# Patient Record
Sex: Male | Born: 1955 | Race: White | Hispanic: No | Marital: Single | Smoking: Former smoker
Health system: Southern US, Community
[De-identification: ages and names within clinical notes are randomized; demographics above are authoritative.]

## PROBLEM LIST (undated history)

## (undated) ENCOUNTER — Emergency Department (HOSPITAL_COMMUNITY): Admission: EM | Payer: Self-pay | Source: Home / Self Care

## (undated) DIAGNOSIS — F17200 Nicotine dependence, unspecified, uncomplicated: Secondary | ICD-10-CM

## (undated) DIAGNOSIS — G2581 Restless legs syndrome: Secondary | ICD-10-CM

## (undated) DIAGNOSIS — E785 Hyperlipidemia, unspecified: Secondary | ICD-10-CM

## (undated) DIAGNOSIS — J302 Other seasonal allergic rhinitis: Secondary | ICD-10-CM

## (undated) DIAGNOSIS — I10 Essential (primary) hypertension: Secondary | ICD-10-CM

## (undated) DIAGNOSIS — IMO0002 Reserved for concepts with insufficient information to code with codable children: Secondary | ICD-10-CM

## (undated) DIAGNOSIS — C801 Malignant (primary) neoplasm, unspecified: Secondary | ICD-10-CM

## (undated) HISTORY — PX: APPENDECTOMY: SHX54

## (undated) HISTORY — DX: Hyperlipidemia, unspecified: E78.5

## (undated) HISTORY — DX: Other seasonal allergic rhinitis: J30.2

## (undated) HISTORY — DX: Nicotine dependence, unspecified, uncomplicated: F17.200

## (undated) HISTORY — DX: Restless legs syndrome: G25.81

## (undated) HISTORY — PX: INNER EAR SURGERY: SHX679

## (undated) HISTORY — DX: Essential (primary) hypertension: I10

## (undated) HISTORY — DX: Reserved for concepts with insufficient information to code with codable children: IMO0002

---
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
## 1986-03-27 DIAGNOSIS — IMO0002 Reserved for concepts with insufficient information to code with codable children: Secondary | ICD-10-CM

## 1986-03-27 HISTORY — DX: Reserved for concepts with insufficient information to code with codable children: IMO0002

## 1999-12-29 ENCOUNTER — Ambulatory Visit (HOSPITAL_BASED_OUTPATIENT_CLINIC_OR_DEPARTMENT_OTHER): Admission: RE | Admit: 1999-12-29 | Discharge: 1999-12-29 | Payer: Self-pay | Admitting: Otolaryngology

## 2001-02-28 ENCOUNTER — Encounter: Payer: Self-pay | Admitting: Otolaryngology

## 2001-03-01 ENCOUNTER — Ambulatory Visit (HOSPITAL_COMMUNITY): Admission: RE | Admit: 2001-03-01 | Discharge: 2001-03-01 | Payer: Self-pay | Admitting: Otolaryngology

## 2001-06-14 ENCOUNTER — Inpatient Hospital Stay (HOSPITAL_COMMUNITY): Admission: EM | Admit: 2001-06-14 | Discharge: 2001-06-20 | Payer: Self-pay | Admitting: *Deleted

## 2001-06-21 ENCOUNTER — Encounter (HOSPITAL_COMMUNITY): Admission: RE | Admit: 2001-06-21 | Discharge: 2001-06-27 | Payer: Self-pay | Admitting: General Surgery

## 2005-04-27 ENCOUNTER — Ambulatory Visit (HOSPITAL_COMMUNITY): Admission: RE | Admit: 2005-04-27 | Discharge: 2005-04-27 | Payer: Self-pay | Admitting: Family Medicine

## 2010-05-30 ENCOUNTER — Ambulatory Visit: Payer: BC Managed Care – PPO | Attending: Family Medicine

## 2010-05-30 DIAGNOSIS — G4733 Obstructive sleep apnea (adult) (pediatric): Secondary | ICD-10-CM | POA: Insufficient documentation

## 2012-07-19 ENCOUNTER — Telehealth: Payer: Self-pay | Admitting: Family Medicine

## 2012-07-19 MED ORDER — FENOFIBRATE 160 MG PO TABS: 160.0000 mg | ORAL_TABLET | Freq: Every day | ORAL | Status: DC

## 2012-07-19 NOTE — Telephone Encounter (Signed)
Rx Refilled  

## 2012-07-23 ENCOUNTER — Encounter: Payer: Self-pay | Admitting: Family Medicine

## 2012-07-23 ENCOUNTER — Ambulatory Visit (INDEPENDENT_AMBULATORY_CARE_PROVIDER_SITE_OTHER): Payer: BC Managed Care – PPO | Admitting: Family Medicine

## 2012-07-23 ENCOUNTER — Encounter: Payer: Self-pay | Admitting: Gastroenterology

## 2012-07-23 ENCOUNTER — Other Ambulatory Visit: Payer: Self-pay | Admitting: Family Medicine

## 2012-07-23 VITALS — BP 136/84 | HR 78 | Temp 98.0°F | Resp 16 | Wt 223.0 lb

## 2012-07-23 DIAGNOSIS — F17200 Nicotine dependence, unspecified, uncomplicated: Secondary | ICD-10-CM

## 2012-07-23 DIAGNOSIS — Z720 Tobacco use: Secondary | ICD-10-CM

## 2012-07-23 DIAGNOSIS — R5381 Other malaise: Secondary | ICD-10-CM

## 2012-07-23 DIAGNOSIS — E785 Hyperlipidemia, unspecified: Secondary | ICD-10-CM | POA: Insufficient documentation

## 2012-07-23 DIAGNOSIS — I10 Essential (primary) hypertension: Secondary | ICD-10-CM

## 2012-07-23 DIAGNOSIS — Z1211 Encounter for screening for malignant neoplasm of colon: Secondary | ICD-10-CM

## 2012-07-23 DIAGNOSIS — R5383 Other fatigue: Secondary | ICD-10-CM

## 2012-07-23 DIAGNOSIS — G2581 Restless legs syndrome: Secondary | ICD-10-CM | POA: Insufficient documentation

## 2012-07-23 DIAGNOSIS — Z125 Encounter for screening for malignant neoplasm of prostate: Secondary | ICD-10-CM

## 2012-07-23 LAB — CBC WITH DIFFERENTIAL/PLATELET
Eosinophils Absolute: 0.2 10*3/uL (ref 0.0–0.7)
Eosinophils Relative: 2 % (ref 0–5)
HCT: 47.6 % (ref 39.0–52.0)
Lymphs Abs: 2 10*3/uL (ref 0.7–4.0)
MCH: 29.8 pg (ref 26.0–34.0)
MCV: 85 fL (ref 78.0–100.0)
Monocytes Absolute: 0.7 10*3/uL (ref 0.1–1.0)
Platelets: 294 10*3/uL (ref 150–400)
RBC: 5.6 MIL/uL (ref 4.22–5.81)
RDW: 14.1 % (ref 11.5–15.5)

## 2012-07-23 LAB — HEPATIC FUNCTION PANEL
ALT: 25 U/L (ref 0–53)
Albumin: 4.2 g/dL (ref 3.5–5.2)
Alkaline Phosphatase: 56 U/L (ref 39–117)
Indirect Bilirubin: 0.4 mg/dL (ref 0.0–0.9)
Total Protein: 6.4 g/dL (ref 6.0–8.3)

## 2012-07-23 MED ORDER — ROPINIROLE HCL 0.25 MG PO TABS: 0.2500 mg | ORAL_TABLET | Freq: Three times a day (TID) | ORAL | Status: DC

## 2012-07-23 NOTE — Progress Notes (Signed)
Subjective:    Patient ID: Kevin [name], male    DOB: 18-Oct-1955, 57 y.o.   MRN: [id number]  HPI  Patient reports that he is having a difficult time sleeping. He states he goes to sleep easily. However he awakens 2-3 times every night. He states that when he wakes up the sheets or tingling not due to his constant motion, flailing, and leg movement. 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 He tried Mirapex at that point but did not like the way this made him feel.  He also reports fatigue. He has no desire to do anything. His stamina has been greatly diminished. He also reports increasing abdominal girth. However his weight is remaining the same. He reports increasing muscle mass.  He also reports mild swelling in both ankles. He is taking Norvasc 10 mg by mouth daily for his blood pressure.  He also reports a daily cough. Is productive of thick mucus. He smokes approximately one pack per day and has for many years. Past Medical History  Diagnosis Date  . Hypertension   . Smoker   . Restless legs syndrome (RLS)    Current Outpatient Prescriptions on File Prior to Visit  Medication Sig Dispense Refill  . fenofibrate 160 MG tablet Take 1 tablet (160 mg total) by mouth daily.  30 tablet  5   No current facility-administered medications on file prior to visit.   No Known Allergies History   Social History  . Marital Status: Single    Spouse Name: N/A    Number of Children: N/A  . Years of Education: N/A   Occupational History  . Not on file.   Social History Main Topics  . Smoking status: Current Every Day Smoker -- 1.00 packs/day    Types: Cigarettes, Cigars  . Smokeless tobacco: Not on file  . Alcohol Use: No  . Drug Use: No  . Sexually Active: Not on file   Other Topics Concern  . Not on file   Social History Narrative  . No narrative on file     Review of Systems  All other systems reviewed and are  negative.       Objective:   Physical Exam  Constitutional: He is oriented to person, place, and time. He appears well-developed and well-nourished.  Eyes: Conjunctivae are normal. Pupils are equal, round, and reactive to light.  Neck: Neck supple. No thyromegaly present.  Cardiovascular: Normal rate, regular rhythm and intact distal pulses.  Exam reveals no gallop and no friction rub.   No murmur heard. Pulmonary/Chest: Effort normal and breath sounds normal. No respiratory distress. He has no wheezes. He has no rales. He exhibits no tenderness.  Abdominal: Soft. Bowel sounds are normal. He exhibits no distension and no mass. There is no tenderness. There is no rebound and no guarding.  Lymphadenopathy:    He has no cervical adenopathy.  Neurological: He is alert and oriented to person, place, and time. He has normal reflexes.          Assessment & Plan:  1. Other malaise and fatigue Obtain some baseline screening labs. I feel patient likely has hypogonadism. - Testosterone - TSH - Basic Metabolic Panel - CBC with Differential - Hepatic Function Panel  2. Prostate cancer screening Also recommended he schedule a colonoscopy due to his age. I will facilitate. - PSA  3. Tobacco abuse Recommend smoking cessation  4. HTN (hypertension) Blood pressure is  well controlled, I recommend he continue the Norvasc and reassure the patient that the swelling in his ankles is normal.  5. RLS (restless legs syndrome) Try Requip 0.25 mg tab, one tablet at night for the first week, 2 tablets at night for the second week, 3 tablets at night for the third week. Advised patient follow with me in one month to discuss how the medicine is working.

## 2012-07-24 ENCOUNTER — Telehealth: Payer: Self-pay | Admitting: Family Medicine

## 2012-07-24 LAB — BASIC METABOLIC PANEL
BUN: 11 mg/dL (ref 6–23)
Chloride: 101 mEq/L (ref 96–112)
Glucose, Bld: 132 mg/dL — ABNORMAL HIGH (ref 70–99)
Potassium: 3.9 mEq/L (ref 3.5–5.3)
Sodium: 143 mEq/L (ref 135–145)

## 2012-07-24 LAB — HEMOGLOBIN A1C: Mean Plasma Glucose: 111 mg/dL (ref <117)

## 2012-07-24 MED ORDER — AMLODIPINE BESYLATE 10 MG PO TABS: 10.0000 mg | ORAL_TABLET | Freq: Every day | ORAL | Status: DC

## 2012-07-24 MED ORDER — HYDROCHLOROTHIAZIDE 25 MG PO TABS: 25.0000 mg | ORAL_TABLET | Freq: Every day | ORAL | Status: DC

## 2012-07-24 NOTE — Telephone Encounter (Signed)
Rx Refilled  

## 2012-07-25 ENCOUNTER — Other Ambulatory Visit: Payer: Self-pay | Admitting: Family Medicine

## 2012-08-12 ENCOUNTER — Telehealth: Payer: Self-pay | Admitting: Family Medicine

## 2012-08-12 NOTE — Telephone Encounter (Signed)
Did requip help? what dose did he take 2 or 3 pills at night?

## 2012-08-13 ENCOUNTER — Telehealth: Payer: Self-pay | Admitting: *Deleted

## 2012-08-13 ENCOUNTER — Telehealth: Payer: Self-pay | Admitting: Family Medicine

## 2012-08-13 NOTE — Telephone Encounter (Signed)
Was given enough at last office visit to hold him until follow up visit

## 2012-08-13 NOTE — Telephone Encounter (Signed)
Kevin [name], did we verify with the patient, is he now taking requip 0.25 mg tabs, 3 tabs at night. If so, please call this to his pharmacy.

## 2012-08-14 MED ORDER — ROPINIROLE HCL 0.25 MG PO TABS: 0.2500 mg | ORAL_TABLET | Freq: Three times a day (TID) | ORAL | Status: DC

## 2012-08-14 NOTE — Telephone Encounter (Signed)
Verified w/patient and refilled meds

## 2012-08-14 NOTE — Telephone Encounter (Signed)
Rx sent 

## 2012-08-23 ENCOUNTER — Encounter: Payer: Self-pay | Admitting: Gastroenterology

## 2012-08-23 ENCOUNTER — Ambulatory Visit (AMBULATORY_SURGERY_CENTER): Payer: BC Managed Care – PPO | Admitting: *Deleted

## 2012-08-23 VITALS — Ht 74.0 in | Wt 220.0 lb

## 2012-08-23 DIAGNOSIS — Z1211 Encounter for screening for malignant neoplasm of colon: Secondary | ICD-10-CM

## 2012-08-23 MED ORDER — MOVIPREP 100 G PO SOLR: ORAL | Status: DC

## 2012-09-02 ENCOUNTER — Ambulatory Visit (AMBULATORY_SURGERY_CENTER): Payer: BC Managed Care – PPO | Admitting: Gastroenterology

## 2012-09-02 ENCOUNTER — Encounter: Payer: Self-pay | Admitting: Gastroenterology

## 2012-09-02 VITALS — BP 141/94 | HR 75 | Temp 97.8°F | Resp 16 | Ht 74.0 in | Wt 220.0 lb

## 2012-09-02 DIAGNOSIS — D126 Benign neoplasm of colon, unspecified: Secondary | ICD-10-CM

## 2012-09-02 DIAGNOSIS — Z1211 Encounter for screening for malignant neoplasm of colon: Secondary | ICD-10-CM

## 2012-09-02 MED ORDER — SODIUM CHLORIDE 0.9 % IV SOLN: 500.0000 mL | INTRAVENOUS | Status: DC

## 2012-09-02 NOTE — Progress Notes (Signed)
Procedure rnds, to recovery, report given and VSS

## 2012-09-02 NOTE — Op Note (Signed)
[facility] [address] [place], [address]   COLONOSCOPY PROCEDURE REPORT  PATIENT: Kevin, [name]  MR#: [id number] BIRTHDATE: [birth year] , 56  yrs. old GENDER: Male ENDOSCOPIST: Mardella Layman, MD, [facility] REFERRED BY: PROCEDURE DATE:  09/02/2012 PROCEDURE:   Colonoscopy with snare polypectomy ASA CLASS:   Class II INDICATIONS:average risk screening. MEDICATIONS: propofol (Diprivan) 400mg  IV  DESCRIPTION OF PROCEDURE:   After the risks and benefits and of the procedure were explained, informed consent was obtained.  A digital rectal exam revealed no abnormalities of the rectum.    The LB [id number]  endoscope was introduced through the anus and advanced to the cecum, which was identified by both the appendix and ileocecal valve .  The quality of the prep was excellent, using MoviPrep .  The instrument was then slowly withdrawn as the colon was fully examined.     COLON FINDINGS:a flat spreading cecal polyp measuring 15 mm in diameter removed with electrocautery snare and retrieved in one piece.  This was labeled specimen #1. A smooth flat polyp ranging between 3-32mm in size was found at the hepatic flexure.  A polypectomy was performed using snare cautery.  The resection was complete and the polyp tissue was not retrieved.   Three polypoid shaped sessile polyps ranging between 5-43mm in size were found in the sigmoid colon.  A polypectomy was performed using snare cautery.  The resection was complete and the polyp tissue was partially retrieved.     Retroflexed views revealed no abnormalities.     The scope was then withdrawn from the patient and the procedure completed.  COMPLICATIONS: There were no complications. ENDOSCOPIC IMPRESSION: 1.   Flat polyp ranging between 3-72mm in size was found at the hepatic flexure; polypectomy was performed using snare cautery ,larger cecal polyp removed in base of cecum and snare excised. 2.  Three sessile  polyps ranging between 5-65mm in size were found in the sigmoid colon; polypectomy was performed using snare cautery  RECOMMENDATIONS: 1.  Await pathology results 2.  Repeat Colonoscopy in 3 years.   REPEAT EXAM:  VH:QIONGEX, Tom MD  _______________________________ eSigned:  Mardella Layman, MD, [facility] 09/02/2012 11:28 AM     PATIENT NAME:  Kevin, [name] MR#: [id number]

## 2012-09-02 NOTE — Progress Notes (Signed)
Patient did not experience any of the following events: a burn prior to discharge; a fall within the facility; wrong site/side/patient/procedure/implant event; or a hospital transfer or hospital admission upon discharge from the facility. (G8907) Patient did not have preoperative order for IV antibiotic SSI prophylaxis. (G8918)  

## 2012-09-02 NOTE — Patient Instructions (Signed)
Colon polyps removed today. Handout given on polyps. No aspirin,aspirin containing products, or anti-inflammatory medications for 2 weeks. Resume current medications. Repeat colonoscopy in 3 years. Call us with any questions or concerns. Thank you!!   YOU HAD AN ENDOSCOPIC PROCEDURE TODAY AT THE [facility]: Refer to the procedure report that was given to you for any specific questions about what was found during the examination.  If the procedure report does not answer your questions, please call your gastroenterologist to clarify.  If you requested that your care partner not be given the details of your procedure findings, then the procedure report has been included in a sealed envelope for you to review at your convenience later.  YOU SHOULD EXPECT: Some feelings of bloating in the abdomen. Passage of more gas than usual.  Walking can help get rid of the air that was put into your GI tract during the procedure and reduce the bloating. If you had a lower endoscopy (such as a colonoscopy or flexible sigmoidoscopy) you may notice spotting of blood in your stool or on the toilet paper. If you underwent a bowel prep for your procedure, then you may not have a normal bowel movement for a few days.  DIET: Your first meal following the procedure should be a light meal and then it is ok to progress to your normal diet.  A half-sandwich or bowl of soup is an example of a good first meal.  Heavy or fried foods are harder to digest and may make you feel nauseous or bloated.  Likewise meals heavy in dairy and vegetables can cause extra gas to form and this can also increase the bloating.  Drink plenty of fluids but you should avoid alcoholic beverages for 24 hours.  ACTIVITY: Your care partner should take you home directly after the procedure.  You should plan to take it easy, moving slowly for the rest of the day.  You can resume normal activity the day after the procedure however you should NOT DRIVE  or use heavy machinery for 24 hours (because of the sedation medicines used during the test).    SYMPTOMS TO REPORT IMMEDIATELY: A gastroenterologist can be reached at any hour.  During normal business hours, 8:30 AM to 5:00 PM Monday through Friday, call [phone].  After hours and on weekends, please call the GI answering service at [phone] who will take a message and have the physician on call contact you.   Following lower endoscopy (colonoscopy or flexible sigmoidoscopy):  Excessive amounts of blood in the stool  Significant tenderness or worsening of abdominal pains  Swelling of the abdomen that is new, acute  Fever of 100F or higher  Following upper endoscopy (EGD)  Vomiting of blood or coffee ground material  New chest pain or pain under the shoulder blades  Painful or persistently difficult swallowing  New shortness of breath  Fever of 100F or higher  Black, tarry-looking stools  FOLLOW UP: If any biopsies were taken you will be contacted by phone or by letter within the next 1-3 weeks.  Call your gastroenterologist if you have not heard about the biopsies in 3 weeks.  Our staff will call the home number listed on your records the next business day following your procedure to check on you and address any questions or concerns that you may have at that time regarding the information given to you following your procedure. This is a courtesy call and so if there is no answer  at the home number and we have not heard from you through the emergency physician on call, we will assume that you have returned to your regular daily activities without incident.  SIGNATURES/CONFIDENTIALITY: You and/or your care partner have signed paperwork which will be entered into your electronic medical record.  These signatures attest to the fact that that the information above on your After Visit Summary has been reviewed and is understood.  Full responsibility of the confidentiality of this  discharge information lies with you and/or your care-partner.

## 2012-09-02 NOTE — Progress Notes (Signed)
Called to room to assist during endoscopic procedure.  Patient ID and intended procedure confirmed with present staff. Received instructions for my participation in the procedure from the performing physician.  

## 2012-09-03 ENCOUNTER — Telehealth: Payer: Self-pay

## 2012-09-03 NOTE — Telephone Encounter (Signed)
  Follow up Call-  Call back number 09/02/2012  Post procedure Call Back phone  # [phone]  Permission to leave phone message Yes     Patient questions:  Do you have a fever, pain , or abdominal swelling? no Pain Score  0 *  Have you tolerated food without any problems? yes  Have you been able to return to your normal activities? yes  Do you have any questions about your discharge instructions: Diet   no Medications  no Follow up visit  no  Do you have questions or concerns about your Care? no  Actions: * If pain score is 4 or above: No action needed, pain <4.

## 2012-09-06 ENCOUNTER — Encounter: Payer: Self-pay | Admitting: Gastroenterology

## 2012-09-23 ENCOUNTER — Emergency Department (INDEPENDENT_AMBULATORY_CARE_PROVIDER_SITE_OTHER): Payer: BC Managed Care – PPO

## 2012-09-23 ENCOUNTER — Emergency Department (INDEPENDENT_AMBULATORY_CARE_PROVIDER_SITE_OTHER)
Admission: EM | Admit: 2012-09-23 | Discharge: 2012-09-23 | Disposition: A | Discharge status: Home / Self Care | Payer: BC Managed Care – PPO | Source: Home / Self Care | Attending: Emergency Medicine | Admitting: Emergency Medicine

## 2012-09-23 DIAGNOSIS — R600 Localized edema: Secondary | ICD-10-CM

## 2012-09-23 DIAGNOSIS — R609 Edema, unspecified: Secondary | ICD-10-CM

## 2012-09-23 DIAGNOSIS — E876 Hypokalemia: Secondary | ICD-10-CM

## 2012-09-23 LAB — PRO B NATRIURETIC PEPTIDE: Pro B Natriuretic peptide (BNP): 71.2 pg/mL (ref 0–125)

## 2012-09-23 LAB — POCT I-STAT, CHEM 8
Chloride: 96 mEq/L (ref 96–112)
Glucose, Bld: 99 mg/dL (ref 70–99)
HCT: 51 % (ref 39.0–52.0)
Hemoglobin: 17.3 g/dL — ABNORMAL HIGH (ref 13.0–17.0)
Potassium: 3.1 mEq/L — ABNORMAL LOW (ref 3.5–5.1)

## 2012-09-23 MED ORDER — FUROSEMIDE 40 MG PO TABS: 40.0000 mg | ORAL_TABLET | Freq: Every day | ORAL | Status: DC

## 2012-09-23 MED ORDER — POTASSIUM CHLORIDE ER 20 MEQ PO TBCR: 20.0000 meq | EXTENDED_RELEASE_TABLET | Freq: Every day | ORAL | Status: DC

## 2012-09-23 NOTE — ED Notes (Signed)
C/o bilateral leg swelling with tenderness.  Denies injury.

## 2012-09-23 NOTE — ED Provider Notes (Signed)
History    CSN: [id number] Arrival date & time 09/23/12  1550  First MD Initiated Contact with Patient 09/23/12 1814     Chief Complaint  Patient presents with  . Leg Swelling   (Consider location/radiation/quality/duration/timing/severity/associated sxs/prior Treatment) HPI  57 yo wm presents today with bilat le edema.  States that this has been ongoing for at least one month.  Has brought this up to PCP but say nothing was done.  Has not been on any long trips or flights.  Admits occasional exertional dyspnea.  Denies cp, orthopnea, fever,chills, abd pain.     Past Medical History  Diagnosis Date  . Hypertension   . Smoker     Quit 08/04/12  . Restless legs syndrome (RLS)   . Ulcer 1988   Past Surgical History  Procedure Laterality Date  . Appendectomy  1957    hernia repair at the same time  . Inner ear surgery  2000, 2002, 2007    bilateral  2x's on right and 1x on the left  . Tonsilectomy, adenoidectomy, bilateral myringotomy and tubes  1965   Family History  Problem Relation Age of Onset  . Colon polyps Brother   . Colon cancer Paternal Aunt   . Colon cancer Cousin   . Lung cancer Father 11   History  Substance Use Topics  . Smoking status: Former Smoker -- 0.00 packs/day    Types: Cigarettes, Cigars    Quit date: 08/04/2012  . Smokeless tobacco: Never Used  . Alcohol Use: 13.2 oz/week    12 Cans of beer, 10 Shots of liquor per week    Review of Systems  Constitutional: Negative.   HENT: Negative.   Eyes: Negative.   Respiratory: Positive for shortness of breath (exertional).   Cardiovascular: Negative.   Gastrointestinal: Negative.   Endocrine: Negative.   Genitourinary: Negative.   Musculoskeletal:       Leg swelling   Skin: Negative.   Neurological: Negative.   Psychiatric/Behavioral: Negative.     Allergies  Review of patient's allergies indicates no known allergies.  Home Medications   Current Outpatient Rx  Name  Route  Sig  Dispense   Refill  . amLODipine (NORVASC) 10 MG tablet   Oral   Take 1 tablet (10 mg total) by mouth daily.   30 tablet   5   . aspirin 81 MG tablet   Oral   Take 81 mg by mouth daily.         . fenofibrate 160 MG tablet   Oral   Take 1 tablet (160 mg total) by mouth daily.   30 tablet   5   . fluticasone (FLONASE) 50 MCG/ACT nasal spray   Nasal   Place 2 sprays into the nose daily.         . furosemide (LASIX) 40 MG tablet   Oral   Take 1 tablet (40 mg total) by mouth daily.   15 tablet   0   . potassium chloride 20 MEQ TBCR   Oral   Take 20 mEq by mouth daily with breakfast.   15 tablet   0   . rOPINIRole (REQUIP) 0.25 MG tablet   Oral   Take 1 tablet (0.25 mg total) by mouth 3 (three) times daily. r.   90 tablet   1    BP 139/94  Pulse 82  Temp(Src) 97.9 F (36.6 C)  Resp 16  SpO2 98% Physical Exam  Constitutional: He is oriented to  person, place, and time. He appears well-developed and well-nourished.  HENT:  Head: Normocephalic and atraumatic.  Eyes: EOM are normal. Pupils are equal, round, and reactive to light.  Neck: Normal range of motion.  Cardiovascular: Normal rate and regular rhythm.   Pulmonary/Chest: Effort normal and breath sounds normal.  Abdominal: Soft. Bowel sounds are normal.  Musculoskeletal: He exhibits edema (bilat 3+ pretib ) and tenderness (bilat calves).  Neurological: He is alert and oriented to person, place, and time.  Skin: Skin is warm and dry.  Psychiatric: He has a normal mood and affect.    ED Course  Procedures (including critical care time) Labs Reviewed  POCT I-STAT, CHEM 8 - Abnormal; Notable for the following:    Potassium 3.1 (*)    Calcium, Ion 1.07 (*)    Hemoglobin 17.3 (*)    All other components within normal limits  PRO B NATRIURETIC PEPTIDE   Dg Chest 2 View  09/23/2012   *RADIOLOGY REPORT*  Clinical Data: Lower extremity swelling.  CHEST - 2 VIEW  Comparison:  None.  Findings:  The heart size and  mediastinal contours are within normal limits.  Both lungs are clear.  The visualized skeletal structures are unremarkable.  IMPRESSION: No active cardiopulmonary disease.   Original Report Authenticated By: Myles Rosenthal, M.D.   1. Bilateral leg edema   2. Hypokalemia     MDM  Pt will d/c hctz and was give script for lasix for leg edema.  Advised that he must f/u with his PCP this week for recheck.  Also given script for kcl.  Elevate legs as much as possible and he can wear compression hose.  Voices understanding.  Will call him to see how he is feeling tomorrow.  May need venous dopplers if calves are still sore after using lasix.   Meds ordered this encounter  Medications  . furosemide (LASIX) 40 MG tablet    Sig: Take 1 tablet (40 mg total) by mouth daily.    Dispense:  15 tablet    Refill:  0  . potassium chloride 20 MEQ TBCR    Sig: Take 20 mEq by mouth daily with breakfast.    Dispense:  15 tablet    Refill:  0      Zonia Kief, PA-C 09/24/12 [phone]

## 2012-09-23 NOTE — ED Notes (Signed)
Opened chart for review.

## 2012-09-24 NOTE — ED Provider Notes (Signed)
Medical screening examination/treatment/procedure(s) were performed by non-physician practitioner and as supervising physician I was immediately available for consultation/collaboration.  Ashaun Gaughan, M.D.  Christopherjame Carnell C Ulanda Tackett, MD 09/24/12 1903 

## 2012-09-25 ENCOUNTER — Telehealth: Payer: Self-pay | Admitting: Family Medicine

## 2012-09-25 ENCOUNTER — Encounter: Payer: Self-pay | Admitting: Family Medicine

## 2012-09-25 NOTE — Progress Notes (Signed)
This encounter was created in error - please disregard.

## 2012-09-25 NOTE — Telephone Encounter (Signed)
Called pt and left message on cell phone to call us to set up a follow up appt. To see Dr. Tanya Nones.  *Pt has contacted our office several times in regards to his medications but has not mentioned his legs swelling any more then what was discussed at his ov 07/23/12 and patient was suppose to follow up with Dr. Tanya Nones in 1 month which would have been 08/22/12 but pt failed to do so. He has not been seen since 07/23/12 and we have received no phone message in regards to his leg swelling.

## 2012-09-29 ENCOUNTER — Encounter: Payer: Self-pay | Admitting: Family Medicine

## 2012-09-30 ENCOUNTER — Ambulatory Visit: Payer: BC Managed Care – PPO | Admitting: Family Medicine

## 2012-09-30 ENCOUNTER — Telehealth: Payer: Self-pay | Admitting: Family Medicine

## 2013-01-30 ENCOUNTER — Other Ambulatory Visit: Payer: Self-pay

## 2013-12-26 ENCOUNTER — Encounter: Payer: Self-pay | Admitting: Cardiology

## 2013-12-26 ENCOUNTER — Ambulatory Visit (INDEPENDENT_AMBULATORY_CARE_PROVIDER_SITE_OTHER): Payer: BC Managed Care – PPO | Admitting: Cardiology

## 2013-12-26 VITALS — BP 120/82 | HR 78 | Ht 74.0 in | Wt 221.0 lb

## 2013-12-26 DIAGNOSIS — I10 Essential (primary) hypertension: Secondary | ICD-10-CM

## 2013-12-26 DIAGNOSIS — R600 Localized edema: Secondary | ICD-10-CM

## 2013-12-26 DIAGNOSIS — R0602 Shortness of breath: Secondary | ICD-10-CM

## 2013-12-26 NOTE — Patient Instructions (Signed)
Your physician recommends that you schedule a follow-up appointment in: 3-4 weeks     Your physician has requested that you have an echocardiogram. Echocardiography is a painless test that uses sound waves to create images of your heart. It provides your doctor with information about the size and shape of your heart and how well your heart's chambers and valves are working. This procedure takes approximately one hour. There are no restrictions for this procedure.       Your physician recommends that you continue on your current medications as directed. Please refer to the Current Medication list given to you today.        Thank you for choosing [facility] !

## 2013-12-26 NOTE — Progress Notes (Signed)
Clinical Summary Kevin [name] is a 58 y.o.male seen today as a new patient for the following medical problems.   1. SOB - started 1 year ago. DOE 1/4 to 1/2 mile, change from 1 year ago where he was able to tolerate much higher levels of exertion - no chest pain. + LE edema, improved with lasix. No orthopnea, no PND, no LE edema. No significant palpitations.    Past Medical History  Diagnosis Date  . Hypertension   . Smoker     Quit 08/04/12  . Restless legs syndrome (RLS)   . Ulcer 1988     No Known Allergies   Current Outpatient Prescriptions  Medication Sig Dispense Refill  . amLODipine (NORVASC) 10 MG tablet Take 1 tablet (10 mg total) by mouth daily.  30 tablet  5  . aspirin 81 MG tablet Take 81 mg by mouth daily.      . fenofibrate 160 MG tablet Take 1 tablet (160 mg total) by mouth daily.  30 tablet  5  . fluticasone (FLONASE) 50 MCG/ACT nasal spray Place 2 sprays into the nose daily.      . furosemide (LASIX) 40 MG tablet Take 1 tablet (40 mg total) by mouth daily.  15 tablet  0  . potassium chloride 20 MEQ TBCR Take 20 mEq by mouth daily with breakfast.  15 tablet  0  . rOPINIRole (REQUIP) 0.25 MG tablet Take 1 tablet (0.25 mg total) by mouth 3 (three) times daily. r.  90 tablet  1  . [DISCONTINUED] hydrochlorothiazide (HYDRODIURIL) 25 MG tablet Take 1 tablet (25 mg total) by mouth daily.  30 tablet  5   No current facility-administered medications for this visit.     Past Surgical History  Procedure Laterality Date  . Appendectomy  1957    hernia repair at the same time  . Inner ear surgery  2000, 2002, 2007    bilateral  2x's on right and 1x on the left  . Tonsilectomy, adenoidectomy, bilateral myringotomy and tubes  1965     No Known Allergies    Family History  Problem Relation Age of Onset  . Colon polyps Brother   . Colon cancer Paternal Aunt   . Colon cancer Cousin   . Lung cancer Father 54     Social History Kevin [name] reports that  he quit smoking about 16 months ago. His smoking use included Cigarettes and Cigars. He smoked 0.00 packs per day. He has never used smokeless tobacco. Kevin [name] reports that he drinks about 13.2 ounces of alcohol per week.   Review of Systems CONSTITUTIONAL: No weight loss, fever, chills, weakness or fatigue.  HEENT: Eyes: No visual loss, blurred vision, double vision or yellow sclerae.No hearing loss, sneezing, congestion, runny nose or sore throat.  SKIN: No rash or itching.  CARDIOVASCULAR: per HPI RESPIRATORY: No shortness of breath, cough or sputum.  GASTROINTESTINAL: No anorexia, nausea, vomiting or diarrhea. No abdominal pain or blood.  GENITOURINARY: No burning on urination, no polyuria NEUROLOGICAL: No headache, dizziness, syncope, paralysis, ataxia, numbness or tingling in the extremities. No change in bowel or bladder control.  MUSCULOSKELETAL: + leg swelling LYMPHATICS: No enlarged nodes. No history of splenectomy.  PSYCHIATRIC: No history of depression or anxiety.  ENDOCRINOLOGIC: No reports of sweating, cold or heat intolerance. No polyuria or polydipsia.  Marland Kitchen   Physical Examination p 78 bp 120/82 Wt 221 lbs BMI 28 Gen: resting comfortably, no acute distress HEENT: no scleral icterus,  pupils equal round and reactive, no palptable cervical adenopathy,  CV: RRR, no m/r/g, no JVD, no carotid bruits Resp: Clear to auscultation bilaterally GI: abdomen is soft, non-tender, non-distended, normal bowel sounds, no hepatosplenomegaly MSK: extremities are warm, trace bilateral edema Skin: warm, no rash Neuro:  no focal deficits Psych: appropriate affect   Diagnostic Studies 12/26/13 Clinic EKG NSR    Assessment and Plan  1. SOB - notes increased SOB and DOE over the last year, along with LE edema around that same period of time - obtain echo to evaluate for possible cardiac etiology  F/u 3-4 weeks      Arnoldo Lenis, M.D.

## 2014-01-02 ENCOUNTER — Ambulatory Visit (HOSPITAL_COMMUNITY)
Admission: RE | Admit: 2014-01-02 | Discharge: 2014-01-02 | Disposition: A | Discharge status: Home / Self Care | Payer: BC Managed Care – PPO | Source: Ambulatory Visit | Attending: Cardiology | Admitting: Cardiology

## 2014-01-02 DIAGNOSIS — I10 Essential (primary) hypertension: Secondary | ICD-10-CM | POA: Diagnosis not present

## 2014-01-02 DIAGNOSIS — I071 Rheumatic tricuspid insufficiency: Secondary | ICD-10-CM | POA: Insufficient documentation

## 2014-01-02 DIAGNOSIS — Z87891 Personal history of nicotine dependence: Secondary | ICD-10-CM | POA: Insufficient documentation

## 2014-01-02 DIAGNOSIS — R0602 Shortness of breath: Secondary | ICD-10-CM | POA: Insufficient documentation

## 2014-01-02 DIAGNOSIS — I517 Cardiomegaly: Secondary | ICD-10-CM

## 2014-01-02 NOTE — Progress Notes (Signed)
  Echocardiogram 2D Echocardiogram has been performed.  [name], Kevin 01/02/2014, 2:50 PM

## 2014-01-30 ENCOUNTER — Encounter: Payer: Self-pay | Admitting: Adult Health

## 2014-01-30 ENCOUNTER — Ambulatory Visit (INDEPENDENT_AMBULATORY_CARE_PROVIDER_SITE_OTHER): Payer: BC Managed Care – PPO | Admitting: Adult Health

## 2014-01-30 VITALS — BP 116/78 | HR 84 | Ht 74.0 in | Wt 223.0 lb

## 2014-01-30 DIAGNOSIS — I10 Essential (primary) hypertension: Secondary | ICD-10-CM

## 2014-01-30 DIAGNOSIS — Z720 Tobacco use: Secondary | ICD-10-CM

## 2014-01-30 NOTE — Progress Notes (Deleted)
Name: Kevin [name]    DOB: 1955-10-20  Age: 58 y.o.  MR#: [id number]       PCP:  Delphina Cahill, MD      Insurance: Payor: [employer] / Plan: [place] / Product Type: *No Product type* /   CC:    Chief Complaint  Patient presents with  . Shortness of Breath    VS Filed Vitals:   01/30/14 1304  BP: 116/78  Pulse: 84  Height: 6\' 2"  (1.88 m)  Weight: 223 lb (101.152 kg)    Weights Current Weight  01/30/14 223 lb (101.152 kg)  12/26/13 221 lb (100.245 kg)  09/02/12 220 lb (99.791 kg)    Blood Pressure  BP Readings from Last 3 Encounters:  01/30/14 116/78  12/26/13 120/82  09/23/12 139/94     Admit date:  (Not on file) Last encounter with RMR:  Visit date not found   Allergy Review of patient's allergies indicates no known allergies.  Current Outpatient Prescriptions  Medication Sig Dispense Refill  . aspirin 81 MG tablet Take 81 mg by mouth daily.    . Fenofibrate Micronized (ANTARA) 90 MG CAPS Take 90 mg by mouth daily.    . fluticasone (FLONASE) 50 MCG/ACT nasal spray Place 2 sprays into the nose daily.    . furosemide (LASIX) 40 MG tablet Take 1 tablet (40 mg total) by mouth daily. 15 tablet 0  . olmesartan (BENICAR) 20 MG tablet Take 20 mg by mouth daily.    . potassium chloride 20 MEQ TBCR Take 20 mEq by mouth daily with breakfast. 15 tablet 0  . zolpidem (AMBIEN) 10 MG tablet Take 10 mg by mouth at bedtime as needed for sleep. 1/2 tab as needed    . [DISCONTINUED] hydrochlorothiazide (HYDRODIURIL) 25 MG tablet Take 1 tablet (25 mg total) by mouth daily. 30 tablet 5   No current facility-administered medications for this visit.    Discontinued Meds:   There are no discontinued medications.  Patient Active Problem List   Diagnosis Date Noted  . Tobacco abuse 07/23/2012  . HTN (hypertension) 07/23/2012  . RLS (restless legs syndrome) 07/23/2012  . HLD (hyperlipidemia) 07/23/2012  . Hypertension   . Restless legs syndrome (RLS)      LABS    Component Value Date/Time   NA 140 09/23/2012 1849   NA 143 07/23/2012 0937   K 3.1* 09/23/2012 1849   K 3.9 07/23/2012 0937   CL 96 09/23/2012 1849   CL 101 07/23/2012 0937   CO2 26 07/23/2012 0937   GLUCOSE 99 09/23/2012 1849   GLUCOSE 132* 07/23/2012 0937   BUN 16 09/23/2012 1849   BUN 11 07/23/2012 0937   CREATININE 1.10 09/23/2012 1849   CREATININE 0.91 07/23/2012 0937   CALCIUM 9.7 07/23/2012 0937   CMP     Component Value Date/Time   NA 140 09/23/2012 1849   K 3.1* 09/23/2012 1849   CL 96 09/23/2012 1849   CO2 26 07/23/2012 0937   GLUCOSE 99 09/23/2012 1849   BUN 16 09/23/2012 1849   CREATININE 1.10 09/23/2012 1849   CREATININE 0.91 07/23/2012 0937   CALCIUM 9.7 07/23/2012 0937   PROT 6.4 07/23/2012 0932   ALBUMIN 4.2 07/23/2012 0932   AST 19 07/23/2012 0932   ALT 25 07/23/2012 0932   ALKPHOS 56 07/23/2012 0932   BILITOT 0.5 07/23/2012 0932       Component Value Date/Time   WBC 9.2 07/23/2012 0937  HGB 17.3* 09/23/2012 1849   HGB 16.7 07/23/2012 0937   HCT 51.0 09/23/2012 1849   HCT 47.6 07/23/2012 0937   MCV 85.0 07/23/2012 0937    Lipid Panel  No results found for: CHOL, TRIG, HDL, CHOLHDL, VLDL, LDLCALC, LDLDIRECT  ABG    Component Value Date/Time   TCO2 31 09/23/2012 1849     Lab Results  Component Value Date   TSH 0.679 07/23/2012   BNP (last 3 results) No results for input(s): PROBNP in the last 8760 hours. Cardiac Panel (last 3 results) No results for input(s): CKTOTAL, CKMB, TROPONINI, RELINDX in the last 72 hours.  Iron/TIBC/Ferritin/ %Sat No results found for: IRON, TIBC, FERRITIN, IRONPCTSAT   EKG Orders placed or performed in visit on 12/26/13  . EKG 12-Lead     Prior Assessment and Plan Problem List as of 01/30/2014      Cardiovascular and Mediastinum   HTN (hypertension)   Hypertension     Other   Tobacco abuse   RLS (restless legs syndrome)   HLD (hyperlipidemia)   Restless legs syndrome (RLS)        Imaging: No results found.

## 2014-01-30 NOTE — Assessment & Plan Note (Signed)
He is trying to quit. He has quit twice before. I have suggested Chantix or Wellbutrin to assist which can be discussed with PCP on follow up. He verbalizes understanding.

## 2014-01-30 NOTE — Assessment & Plan Note (Signed)
His echo demonstrated diastolic dysfuncton grade II. He will continue current medication regimen and BP control. I made a copy of this echo for him to taek to PCP. I recommend PFTs to evaluate lung capacity and function. See him again in 6 months unless he is worsening. No plans for stress test at this time ashe has normal systolic fx and no pain. Can consider this if symptoms of dyspnea persist.

## 2014-01-30 NOTE — Progress Notes (Signed)
    HPI: [name] is a 58 y/o patient of Dr. Harl Bowie we are following for ongoing assessment and management of chronic dyspnea. The patient was last seen in the office on 12/26/2013 with complaints of [place] Heart Association class II symptoms. On last office visit the patient was ordered. An echo to evaluate for possible cardiac etiology of his dyspnea.  Echocardiogram was completed on 01/02/2014. Revealing systolic function, normal, with an EF of 55-60%. He did have grade 2 diastolic dysfunction. Pulmonary artery systolic pressure could not be accurately estimated. He attributed tricuspid regurg with mild RV enlargement.  He continues to smoke but is trying to quit. He suffers from chronic allergies and sinusitis. Denies chest pain or palpitations.,     No Known Allergies  Current Outpatient Prescriptions  Medication Sig Dispense Refill  . aspirin 81 MG tablet Take 81 mg by mouth daily.    . Fenofibrate Micronized (ANTARA) 90 MG CAPS Take 90 mg by mouth daily.    . fluticasone (FLONASE) 50 MCG/ACT nasal spray Place 2 sprays into the nose daily.    . furosemide (LASIX) 40 MG tablet Take 1 tablet (40 mg total) by mouth daily. 15 tablet 0  . olmesartan (BENICAR) 20 MG tablet Take 20 mg by mouth daily.    . potassium chloride 20 MEQ TBCR Take 20 mEq by mouth daily with breakfast. 15 tablet 0  . zolpidem (AMBIEN) 10 MG tablet Take 10 mg by mouth at bedtime as needed for sleep. 1/2 tab as needed    . [DISCONTINUED] hydrochlorothiazide (HYDRODIURIL) 25 MG tablet Take 1 tablet (25 mg total) by mouth daily. 30 tablet 5   No current facility-administered medications for this visit.    Past Medical History  Diagnosis Date  . Hypertension   . Smoker     Quit 08/04/12  . Restless legs syndrome (RLS)   . Ulcer 1988    Past Surgical History  Procedure Laterality Date  . Appendectomy  1957    hernia repair at the same time  . Inner ear surgery  2000, 2002, 2007    bilateral  2x's on  right and 1x on the left  . Tonsilectomy, adenoidectomy, bilateral myringotomy and tubes  1965    ROS: Review of systems complete and found to be negative unless listed above  PHYSICAL EXAM BP 116/78 mmHg  Pulse 84  Ht 6\' 2"  (1.88 m)  Wt 223 lb (101.152 kg)  BMI 28.62 kg/m2 General: Well developed, well nourished, in no acute distress Head: Eyes PERRLA, No xanthomas.   Normal cephalic and atramatic  Lungs: Clear bilaterally to auscultation and percussion. Heart: HRRR S1 S2, without MRG.  Pulses are 2+ & equal.            No carotid bruit. No JVD.  No abdominal bruits. No femoral bruits. Abdomen: Bowel sounds are positive, abdomen soft and non-tender without masses or                  Hernia's noted. Msk:  Back normal, normal gait. Normal strength and tone for age. Extremities: No clubbing, cyanosis or edema.  DP +1 Neuro: Alert and oriented X 3. Psych:  Good affect, responds appropriately  ASSESSMENT AND PLAN

## 2014-01-30 NOTE — Patient Instructions (Signed)
Your physician wants you to follow-up in: 6 months You will receive a reminder letter in the mail two months in advance. If you don't receive a letter, please call our office to schedule the follow-up appointment.     Your physician recommends that you continue on your current medications as directed. Please refer to the Current Medication list given to you today.  Speak with Dr.Hall regarding pulmonary function tets   Thank you for choosing [facility] !

## 2014-10-13 ENCOUNTER — Ambulatory Visit (INDEPENDENT_AMBULATORY_CARE_PROVIDER_SITE_OTHER): Payer: BLUE CROSS/BLUE SHIELD | Admitting: Cardiology

## 2014-10-13 ENCOUNTER — Encounter: Payer: Self-pay | Admitting: Cardiology

## 2014-10-13 ENCOUNTER — Ambulatory Visit (HOSPITAL_COMMUNITY)
Admission: RE | Admit: 2014-10-13 | Discharge: 2014-10-13 | Disposition: A | Discharge status: Home / Self Care | Payer: BLUE CROSS/BLUE SHIELD | Source: Ambulatory Visit | Attending: Cardiology | Admitting: Cardiology

## 2014-10-13 VITALS — BP 117/79 | HR 71 | Ht 74.0 in | Wt 217.8 lb

## 2014-10-13 DIAGNOSIS — Z720 Tobacco use: Secondary | ICD-10-CM | POA: Diagnosis not present

## 2014-10-13 DIAGNOSIS — G2581 Restless legs syndrome: Secondary | ICD-10-CM | POA: Diagnosis not present

## 2014-10-13 DIAGNOSIS — R0789 Other chest pain: Secondary | ICD-10-CM

## 2014-10-13 DIAGNOSIS — I10 Essential (primary) hypertension: Secondary | ICD-10-CM | POA: Insufficient documentation

## 2014-10-13 DIAGNOSIS — R05 Cough: Secondary | ICD-10-CM

## 2014-10-13 DIAGNOSIS — R059 Cough, unspecified: Secondary | ICD-10-CM

## 2014-10-13 DIAGNOSIS — E785 Hyperlipidemia, unspecified: Secondary | ICD-10-CM | POA: Diagnosis not present

## 2014-10-13 MED ORDER — AZITHROMYCIN 250 MG PO TABS: ORAL_TABLET | ORAL | Status: DC

## 2014-10-13 NOTE — Patient Instructions (Signed)
Your physician wants you to follow-up in: [place] DR. BRANCH You will receive a reminder letter in the mail two months in advance. If you don't receive a letter, please call our office to schedule the follow-up appointment.  Your physician has recommended you make the following change in your medication:   START Z-PAK 500 MG ON DAY 1 AND THEN 250 MG DAILY FOR 4 DAYS  TAKE OVER THE COUNTER IB PROFEN 400 MG AS NEEDED  A chest x-ray takes a picture of the organs and structures inside the chest, including the heart, lungs, and blood vessels. This test can show several things, including, whether the heart is enlarges; whether fluid is building up in the lungs; and whether pacemaker / defibrillator leads are still in place.  Thank you for choosing [facility]!!

## 2014-10-13 NOTE — Progress Notes (Signed)
Patient ID: Kevin [name], male   DOB: 09-19-1955, 59 y.o.   MRN: [id number]     Clinical Summary [name] is a 59 y.o.male seen today for the follow up of the following medical problems. This is an add on visit for recent symptoms of chest pain and SOB.   1. Chest pain/SOB - symptoms started last week - pressure throughout entire chest x 1 week. Not positional. Constant x 1 week without any relief. +cough with yellowish sputum, nasal congestion, no fevers chills. Fatigue. No N/V/D. No polyuria, no dysuria.   Past Medical History  Diagnosis Date  . Hypertension   . Smoker     Quit 08/04/12  . Restless legs syndrome (RLS)   . Ulcer 1988     No Known Allergies   Current Outpatient Prescriptions  Medication Sig Dispense Refill  . aspirin 81 MG tablet Take 81 mg by mouth daily.    . Fenofibrate Micronized (ANTARA) 90 MG CAPS Take 90 mg by mouth daily.    . fluticasone (FLONASE) 50 MCG/ACT nasal spray Place 2 sprays into the nose daily.    . furosemide (LASIX) 40 MG tablet Take 1 tablet (40 mg total) by mouth daily. 15 tablet 0  . olmesartan (BENICAR) 20 MG tablet Take 20 mg by mouth daily.    . potassium chloride 20 MEQ TBCR Take 20 mEq by mouth daily with breakfast. 15 tablet 0  . zolpidem (AMBIEN) 10 MG tablet Take 10 mg by mouth at bedtime as needed for sleep. 1/2 tab as needed    . [DISCONTINUED] hydrochlorothiazide (HYDRODIURIL) 25 MG tablet Take 1 tablet (25 mg total) by mouth daily. 30 tablet 5   No current facility-administered medications for this visit.     Past Surgical History  Procedure Laterality Date  . Appendectomy  1957    hernia repair at the same time  . Inner ear surgery  2000, 2002, 2007    bilateral  2x's on right and 1x on the left  . Tonsilectomy, adenoidectomy, bilateral myringotomy and tubes  1965     No Known Allergies    Family History  Problem Relation Age of Onset  . Colon polyps Brother   . Colon cancer Paternal Aunt   . Colon  cancer Cousin   . Lung cancer Father 42     Social History Kevin [name] reports that he has been smoking Cigarettes and Cigars.  He has been smoking about 1.00 pack per day. He has never used smokeless tobacco. Kevin [name] reports that he drinks about 13.2 oz of alcohol per week.   Review of Systems CONSTITUTIONAL: No weight loss, fever, chills, weakness or fatigue.  HEENT: Eyes: No visual loss, blurred vision, double vision or yellow sclerae.No hearing loss, sneezing, congestion, runny nose or sore throat.  SKIN: No rash or itching.  CARDIOVASCULAR: per HPI RESPIRATORY: per HPI GASTROINTESTINAL: No anorexia, nausea, vomiting or diarrhea. No abdominal pain or blood.  GENITOURINARY: No burning on urination, no polyuria NEUROLOGICAL: No headache, dizziness, syncope, paralysis, ataxia, numbness or tingling in the extremities. No change in bowel or bladder control.  MUSCULOSKELETAL: No muscle, back pain, joint pain or stiffness.  LYMPHATICS: No enlarged nodes. No history of splenectomy.  PSYCHIATRIC: No history of depression or anxiety.  ENDOCRINOLOGIC: No reports of sweating, cold or heat intolerance. No polyuria or polydipsia.  Marland Kitchen   Physical Examination Filed Vitals:   10/13/14 0852  BP: 117/79  Pulse: 71   Filed Vitals:   10/13/14 0852  BP: 117/79  Pulse: 71    Gen: resting comfortably, no acute distress HEENT: no scleral icterus, pupils equal round and reactive, no palptable cervical adenopathy,  CV: RRR, no m/r/g, no JVD Resp: Clear to auscultation bilaterally GI: abdomen is soft, non-tender, non-distended, normal bowel sounds, no hepatosplenomegaly MSK: extremities are warm, no edema. Chest pain reproducible to palpation Skin: warm, no rash Neuro:  no focal deficits Psych: appropriate affect   Diagnostic Studies  12/26/13 Clinic EKG NSR  01-06-2014 echo Study Conclusions  - Left ventricle: The cavity size was normal. Wall thickness was increased in a pattern of  mild LVH. Systolic function was normal. The estimated ejection fraction was in the range of 55% to 60%. Wall motion was normal; there were no regional wall motion abnormalities. Features are consistent with a pseudonormal left ventricular filling pattern, with concomitant abnormal relaxation and increased filling pressure (grade 2 diastolic dysfunction). - Mitral valve: Calcified annulus. There was trivial regurgitation. - Right ventricle: The cavity size was mildly dilated. - Right atrium: Central venous pressure (est): 3 mm Hg. - Atrial septum: No defect or patent foramen ovale was identified. - Tricuspid valve: There was trivial regurgitation. - Pulmonary arteries: Systolic pressure could not be accurately estimated. - Pericardium, extracardiac: There was no pericardial effusion.  Impressions:  - Mild LVH with LVEF 46-96%, grade 2 diastolic dysfunction. Mild RV enlargement. Trivial tricuspid regurgitation. Unable to assess PASP.    Assessment and Plan   1. Chest pain/SOB - symptoms consistent with probable URI with associated costochondritis. Chest pain is constant x 7 days and reproducible to palpation, EKG without ischemic changes - will order CXR. Given 5 day course of azithromycin, recommend ibuprofen '400mg'$  bid x 5 days - asked if continued symptoms to f/u with his pcp     Arnoldo Lenis, M.D

## 2014-10-14 ENCOUNTER — Telehealth: Payer: Self-pay | Admitting: *Deleted

## 2014-10-14 NOTE — Telephone Encounter (Signed)
Pt aware, routed to pcp 

## 2014-10-14 NOTE — Telephone Encounter (Signed)
-----   Message from Arnoldo Lenis, MD sent at 10/14/2014  2:49 PM EDT ----- CXR shows bronchitis but not pneumonia, the antibiotic should help clear it up  Zandra Abts MD

## 2015-07-22 ENCOUNTER — Encounter: Payer: Self-pay | Admitting: Gastroenterology

## 2015-08-24 DIAGNOSIS — M9902 Segmental and somatic dysfunction of thoracic region: Secondary | ICD-10-CM | POA: Diagnosis not present

## 2015-08-24 DIAGNOSIS — M545 Low back pain: Secondary | ICD-10-CM | POA: Diagnosis not present

## 2015-08-24 DIAGNOSIS — M9903 Segmental and somatic dysfunction of lumbar region: Secondary | ICD-10-CM | POA: Diagnosis not present

## 2015-08-24 DIAGNOSIS — M9905 Segmental and somatic dysfunction of pelvic region: Secondary | ICD-10-CM | POA: Diagnosis not present

## 2015-08-26 DIAGNOSIS — M9902 Segmental and somatic dysfunction of thoracic region: Secondary | ICD-10-CM | POA: Diagnosis not present

## 2015-08-26 DIAGNOSIS — M545 Low back pain: Secondary | ICD-10-CM | POA: Diagnosis not present

## 2015-08-26 DIAGNOSIS — M9903 Segmental and somatic dysfunction of lumbar region: Secondary | ICD-10-CM | POA: Diagnosis not present

## 2015-08-26 DIAGNOSIS — M9905 Segmental and somatic dysfunction of pelvic region: Secondary | ICD-10-CM | POA: Diagnosis not present

## 2015-12-31 DIAGNOSIS — F5101 Primary insomnia: Secondary | ICD-10-CM | POA: Diagnosis not present

## 2015-12-31 DIAGNOSIS — Z23 Encounter for immunization: Secondary | ICD-10-CM | POA: Diagnosis not present

## 2015-12-31 DIAGNOSIS — R7301 Impaired fasting glucose: Secondary | ICD-10-CM | POA: Diagnosis not present

## 2015-12-31 DIAGNOSIS — E782 Mixed hyperlipidemia: Secondary | ICD-10-CM | POA: Diagnosis not present

## 2015-12-31 DIAGNOSIS — I10 Essential (primary) hypertension: Secondary | ICD-10-CM | POA: Diagnosis not present

## 2016-05-12 DIAGNOSIS — J019 Acute sinusitis, unspecified: Secondary | ICD-10-CM | POA: Diagnosis not present

## 2016-05-12 DIAGNOSIS — Z6833 Body mass index (BMI) 33.0-33.9, adult: Secondary | ICD-10-CM | POA: Diagnosis not present

## 2016-05-12 DIAGNOSIS — F17200 Nicotine dependence, unspecified, uncomplicated: Secondary | ICD-10-CM | POA: Diagnosis not present

## 2016-06-17 DIAGNOSIS — Z6833 Body mass index (BMI) 33.0-33.9, adult: Secondary | ICD-10-CM | POA: Diagnosis not present

## 2016-06-17 DIAGNOSIS — J0100 Acute maxillary sinusitis, unspecified: Secondary | ICD-10-CM | POA: Diagnosis not present

## 2016-06-17 DIAGNOSIS — Z720 Tobacco use: Secondary | ICD-10-CM | POA: Diagnosis not present

## 2016-06-28 DIAGNOSIS — I10 Essential (primary) hypertension: Secondary | ICD-10-CM | POA: Diagnosis not present

## 2016-06-28 DIAGNOSIS — Z0000 Encounter for general adult medical examination without abnormal findings: Secondary | ICD-10-CM | POA: Diagnosis not present

## 2016-06-28 DIAGNOSIS — Z1159 Encounter for screening for other viral diseases: Secondary | ICD-10-CM | POA: Diagnosis not present

## 2016-06-28 DIAGNOSIS — R7301 Impaired fasting glucose: Secondary | ICD-10-CM | POA: Diagnosis not present

## 2016-07-19 DIAGNOSIS — I10 Essential (primary) hypertension: Secondary | ICD-10-CM | POA: Diagnosis not present

## 2016-07-19 DIAGNOSIS — R7301 Impaired fasting glucose: Secondary | ICD-10-CM | POA: Diagnosis not present

## 2016-07-19 DIAGNOSIS — E782 Mixed hyperlipidemia: Secondary | ICD-10-CM | POA: Diagnosis not present

## 2016-07-19 DIAGNOSIS — F5101 Primary insomnia: Secondary | ICD-10-CM | POA: Diagnosis not present

## 2016-07-25 DIAGNOSIS — H6501 Acute serous otitis media, right ear: Secondary | ICD-10-CM | POA: Diagnosis not present

## 2016-08-18 DIAGNOSIS — H6503 Acute serous otitis media, bilateral: Secondary | ICD-10-CM | POA: Diagnosis not present

## 2016-08-25 DIAGNOSIS — H6500 Acute serous otitis media, unspecified ear: Secondary | ICD-10-CM | POA: Diagnosis not present

## 2016-10-04 DIAGNOSIS — H748X3 Other specified disorders of middle ear and mastoid, bilateral: Secondary | ICD-10-CM | POA: Diagnosis not present

## 2016-10-04 DIAGNOSIS — H938X1 Other specified disorders of right ear: Secondary | ICD-10-CM | POA: Diagnosis not present

## 2016-10-04 DIAGNOSIS — H6122 Impacted cerumen, left ear: Secondary | ICD-10-CM | POA: Diagnosis not present

## 2016-10-04 DIAGNOSIS — J309 Allergic rhinitis, unspecified: Secondary | ICD-10-CM | POA: Diagnosis not present

## 2016-10-13 DIAGNOSIS — H6091 Unspecified otitis externa, right ear: Secondary | ICD-10-CM | POA: Diagnosis not present

## 2016-10-13 DIAGNOSIS — H9211 Otorrhea, right ear: Secondary | ICD-10-CM | POA: Diagnosis not present

## 2016-10-13 DIAGNOSIS — H906 Mixed conductive and sensorineural hearing loss, bilateral: Secondary | ICD-10-CM | POA: Diagnosis not present

## 2016-10-26 DIAGNOSIS — M545 Low back pain: Secondary | ICD-10-CM | POA: Diagnosis not present

## 2016-10-26 DIAGNOSIS — M9902 Segmental and somatic dysfunction of thoracic region: Secondary | ICD-10-CM | POA: Diagnosis not present

## 2016-10-26 DIAGNOSIS — M9903 Segmental and somatic dysfunction of lumbar region: Secondary | ICD-10-CM | POA: Diagnosis not present

## 2016-10-26 DIAGNOSIS — M9905 Segmental and somatic dysfunction of pelvic region: Secondary | ICD-10-CM | POA: Diagnosis not present

## 2016-10-27 DIAGNOSIS — M9905 Segmental and somatic dysfunction of pelvic region: Secondary | ICD-10-CM | POA: Diagnosis not present

## 2016-10-27 DIAGNOSIS — M9902 Segmental and somatic dysfunction of thoracic region: Secondary | ICD-10-CM | POA: Diagnosis not present

## 2016-10-27 DIAGNOSIS — M9903 Segmental and somatic dysfunction of lumbar region: Secondary | ICD-10-CM | POA: Diagnosis not present

## 2016-10-27 DIAGNOSIS — M545 Low back pain: Secondary | ICD-10-CM | POA: Diagnosis not present

## 2016-11-03 DIAGNOSIS — H6983 Other specified disorders of Eustachian tube, bilateral: Secondary | ICD-10-CM | POA: Diagnosis not present

## 2016-11-03 DIAGNOSIS — H748X3 Other specified disorders of middle ear and mastoid, bilateral: Secondary | ICD-10-CM | POA: Diagnosis not present

## 2016-11-03 DIAGNOSIS — H6091 Unspecified otitis externa, right ear: Secondary | ICD-10-CM | POA: Diagnosis not present

## 2016-11-16 DIAGNOSIS — Z1159 Encounter for screening for other viral diseases: Secondary | ICD-10-CM | POA: Diagnosis not present

## 2016-11-16 DIAGNOSIS — R7301 Impaired fasting glucose: Secondary | ICD-10-CM | POA: Diagnosis not present

## 2016-11-16 DIAGNOSIS — I10 Essential (primary) hypertension: Secondary | ICD-10-CM | POA: Diagnosis not present

## 2016-11-16 DIAGNOSIS — Z125 Encounter for screening for malignant neoplasm of prostate: Secondary | ICD-10-CM | POA: Diagnosis not present

## 2016-11-20 DIAGNOSIS — I10 Essential (primary) hypertension: Secondary | ICD-10-CM | POA: Diagnosis not present

## 2016-11-20 DIAGNOSIS — R7301 Impaired fasting glucose: Secondary | ICD-10-CM | POA: Diagnosis not present

## 2016-11-20 DIAGNOSIS — E782 Mixed hyperlipidemia: Secondary | ICD-10-CM | POA: Diagnosis not present

## 2016-11-20 DIAGNOSIS — F5101 Primary insomnia: Secondary | ICD-10-CM | POA: Diagnosis not present

## 2016-12-11 ENCOUNTER — Encounter: Payer: Self-pay | Admitting: Gastroenterology

## 2017-01-01 ENCOUNTER — Ambulatory Visit (HOSPITAL_COMMUNITY)
Admission: RE | Admit: 2017-01-01 | Discharge: 2017-01-01 | Disposition: A | Discharge status: Home / Self Care | Payer: BLUE CROSS/BLUE SHIELD | Source: Ambulatory Visit | Attending: Adult Health | Admitting: Adult Health

## 2017-01-01 ENCOUNTER — Encounter: Payer: Self-pay | Admitting: Adult Health

## 2017-01-01 ENCOUNTER — Ambulatory Visit (INDEPENDENT_AMBULATORY_CARE_PROVIDER_SITE_OTHER): Payer: BLUE CROSS/BLUE SHIELD | Admitting: Adult Health

## 2017-01-01 ENCOUNTER — Encounter: Payer: Self-pay | Admitting: *Deleted

## 2017-01-01 VITALS — BP 152/94 | HR 77 | Ht 74.0 in | Wt 238.0 lb

## 2017-01-01 DIAGNOSIS — R05 Cough: Secondary | ICD-10-CM | POA: Diagnosis not present

## 2017-01-01 DIAGNOSIS — R059 Cough, unspecified: Secondary | ICD-10-CM

## 2017-01-01 DIAGNOSIS — R0600 Dyspnea, unspecified: Secondary | ICD-10-CM | POA: Diagnosis not present

## 2017-01-01 DIAGNOSIS — R079 Chest pain, unspecified: Secondary | ICD-10-CM | POA: Diagnosis not present

## 2017-01-01 DIAGNOSIS — R918 Other nonspecific abnormal finding of lung field: Secondary | ICD-10-CM | POA: Diagnosis not present

## 2017-01-01 DIAGNOSIS — I10 Essential (primary) hypertension: Secondary | ICD-10-CM

## 2017-01-01 DIAGNOSIS — I700 Atherosclerosis of aorta: Secondary | ICD-10-CM | POA: Diagnosis not present

## 2017-01-01 DIAGNOSIS — R0602 Shortness of breath: Secondary | ICD-10-CM | POA: Diagnosis not present

## 2017-01-01 NOTE — Patient Instructions (Signed)
Medication Instructions:  Your physician recommends that you continue on your current medications as directed. Please refer to the Current Medication list given to you today.   Labwork: NONE   Testing/Procedures: Your physician has requested that you have en exercise stress myoview. For further information please visit [email or web address]. Please follow instruction sheet, as given.  Your physician has requested that you have an echocardiogram. Echocardiography is a painless test that uses sound waves to create images of your heart. It provides your doctor with information about the size and shape of your heart and how well your heart's chambers and valves are working. This procedure takes approximately one hour. There are no restrictions for this procedure.  A chest x-ray takes a picture of the organs and structures inside the chest, including the heart, lungs, and blood vessels. This test can show several things, including, whether the heart is enlarges; whether fluid is building up in the lungs; and whether pacemaker / defibrillator leads are still in place.   Follow-Up: Your physician recommends that you schedule a follow-up appointment after test.    Any Other Special Instructions Will Be Listed Below (If Applicable).     If you need a refill on your cardiac medications before your next appointment, please call your pharmacy. Thank you for choosing [facility]!

## 2017-01-01 NOTE — Progress Notes (Signed)
Cardiology Office Note   Date:  01/01/2017   ID:  Kevin [name], DOB [birth year], MRN [id number]  PCP:  Celene Squibb, MD  Cardiologist:  Carlyle Dolly   Chief Complaint  Patient presents with  . Chest Pain  . Shortness of Breath  . Hypertension     History of Present Illness: Kevin [name] is a 61 y.o. male who presents for follow-up of chronic dyspnea. The patient is not been seen in the office since 01/30/2014, at which time echocardiogram had been completed revealing normal LV systolic function with grade 2 diastolic dysfunction. Patient also has a  History of ongoing tobacco abuse. He comes today with complaints of chest tightness.  He is had no hospitalizations, new diagnoses, surgeries, new medications, or allergies since being seen last.  He states over the last 2 weeks to one month he has had worsening chest pressure, dyspnea, some coughing. He quit smoking 5 months ago. He states that his energy level has decreased over the last few weeks. He has become quite fatigued. He denies dizziness, palpitations, near syncope. He takes his blood pressure at home and finds it to be in the 308M to 5:78 systolic. Today this elevated. He is medically compliant. He has seen his primary care physician within the last month and had lab work completed.   Past Medical History:  Diagnosis Date  . Hypertension   . Restless legs syndrome (RLS)   . Smoker    Quit 08/04/12  . Ulcer 1988    Past Surgical History:  Procedure Laterality Date  . APPENDECTOMY  1957   hernia repair at the same time  . [place]  2000, 2002, 2007   bilateral  2x's on right and 1x on the left  . TONSILECTOMY, ADENOIDECTOMY, BILATERAL MYRINGOTOMY AND TUBES  1965     Current Outpatient Prescriptions  Medication Sig Dispense Refill  . aspirin 81 MG tablet Take 81 mg by mouth daily.    . furosemide (LASIX) 40 MG tablet Take 1 tablet (40 mg total) by mouth daily. 15 tablet 0  . potassium chloride 20  MEQ TBCR Take 20 mEq by mouth daily with breakfast. 15 tablet 0  . pravastatin (PRAVACHOL) 40 MG tablet Take 40 mg by mouth daily.    Marland Kitchen telmisartan (MICARDIS) 80 MG tablet     . zolpidem (AMBIEN) 10 MG tablet Take 10 mg by mouth at bedtime as needed for sleep. 1/2 tab as needed     No current facility-administered medications for this visit.     Allergies:   Patient has no known allergies.    Social History:  The patient  reports that he has been smoking Cigarettes and Cigars.  He has been smoking about 0.50 packs per day. He has never used smokeless tobacco. He reports that he drinks about 13.2 oz of alcohol per week . He reports that he does not use drugs.   Family History:  The patient's family history includes Colon cancer in his cousin and paternal aunt; Colon polyps in his brother; Lung cancer (age of onset: 36) in his father.    ROS: All other systems are reviewed and negative. Unless otherwise mentioned in H&P    PHYSICAL EXAM: VS:  BP (!) 152/94   Pulse 77   Ht 6\' 2"  (1.88 m)   Wt 238 lb (108 kg)   SpO2 94%   BMI 30.56 kg/m  , BMI Body mass index is 30.56 kg/m. GEN: Well nourished, well  developed, in no acute distress  HEENT: normal  Neck: no JVD, carotid bruits, or masses Cardiac: RRR; no murmurs, rubs, or gallops,no edema  Respiratory: Clear to auscultation bilaterally, normal work of breathing, occasional cough. GI: soft, nontender, nondistended, + BS MS: no deformity or atrophy  Skin: warm and dry, no rash Neuro:  Strength and sensation are intact Psych: euthymic mood, full affect   EKG:  NSR rate of 75 bpm.   Recent Labs: No results found for requested labs within last 8760 hours.    Lipid Panel No results found for: CHOL, TRIG, HDL, CHOLHDL, VLDL, LDLCALC, LDLDIRECT    Wt Readings from Last 3 Encounters:  01/01/17 238 lb (108 kg)  10/13/14 217 lb 12.8 oz (98.8 kg)  01/30/14 223 lb (101.2 kg)    Other studies Reviewed: Echocardiogram  01/02/2014 Left ventricle: The cavity size was normal. Wall thickness was increased in a pattern of mild LVH. Systolic function was normal. The estimated ejection fraction was in the range of 55% to 60%. Wall motion was normal; there were no regional wall motion abnormalities. Features are consistent with a pseudonormal left ventricular filling pattern, with concomitant abnormal relaxation and increased filling pressure (grade 2 diastolic dysfunction). - Mitral valve: Calcified annulus. There was trivial regurgitation. - Right ventricle: The cavity size was mildly dilated. - Right atrium: Central venous pressure (est): 3 mm Hg. - Atrial septum: No defect or patent foramen ovale was identified. - Tricuspid valve: There was trivial regurgitation. - Pulmonary arteries: Systolic pressure could not be accurately estimated. - Pericardium, extracardiac: There was no pericardial effusion.  ASSESSMENT AND PLAN:  1. Chest discomfort: Typical and atypical features, described as a constant pressure, nonradiating, he is not very active and does not express worsening discomfort with activity. However his breathing status has worsened, he continues to have some coughing, and he has generalized fatigue.  I will schedule echocardiogram for changes in LV systolic function in the setting of worsening fatigue and dyspnea. He will also have a nuclear medicine stress Myoview for diagnostic prognostic purposes for evaluation for ischemia causing symptoms. A chest x-ray will also be ordered to evaluate for pulmonary etiology for his chest pressure cough and dyspnea. I will request labs from primary care as they have recently been obtained within the last month. There are no medications that need to be held prior to this stress test.  2. Hypertension: Blood pressure is elevated today. He states it is normally lower at home. He is currently on telmisartan 80 mg daily and Lasix 40 mg daily. Will not make  any changes on his medication at this time.    Current medicines are reviewed at length with the patient today.    Labs/ tests ordered today include: stress Myoview, echocardiogram, chest x-ray.   Phill Myron. West Pugh, ANP, AACC   01/01/2017 4:05 PM    [facility] [address], [place], [place] [address] Phone: [phone]; Fax: [phone]

## 2017-01-02 ENCOUNTER — Telehealth: Payer: Self-pay

## 2017-01-02 NOTE — Telephone Encounter (Signed)
-----   Message from Lendon Colonel, NP sent at 01/02/2017 10:41 AM EDT ----- Chest X-Ray is abnormal. Possible lung disease, could be emphysema or COPD. Will need to see PCP for further evaluation, treatment or referral to pulmonologist, with consideration for PFTs.

## 2017-01-02 NOTE — Telephone Encounter (Signed)
Called pt. No answer. Left message for pt. To return call.

## 2017-01-05 DIAGNOSIS — Z6834 Body mass index (BMI) 34.0-34.9, adult: Secondary | ICD-10-CM | POA: Diagnosis not present

## 2017-01-05 DIAGNOSIS — R0600 Dyspnea, unspecified: Secondary | ICD-10-CM | POA: Diagnosis not present

## 2017-01-05 DIAGNOSIS — Z23 Encounter for immunization: Secondary | ICD-10-CM | POA: Diagnosis not present

## 2017-01-10 ENCOUNTER — Telehealth: Payer: Self-pay | Admitting: Adult Health

## 2017-01-10 NOTE — Telephone Encounter (Signed)
Patient wants to know "what a stress test is". / tg

## 2017-01-10 NOTE — Telephone Encounter (Signed)
The process for exercise stress myoview was explained to patient in detail. Patient verbalized understanding.

## 2017-01-12 ENCOUNTER — Ambulatory Visit (HOSPITAL_BASED_OUTPATIENT_CLINIC_OR_DEPARTMENT_OTHER)
Admission: RE | Admit: 2017-01-12 | Discharge: 2017-01-12 | Disposition: A | Discharge status: Home / Self Care | Payer: BLUE CROSS/BLUE SHIELD | Source: Ambulatory Visit | Attending: Adult Health | Admitting: Adult Health

## 2017-01-12 ENCOUNTER — Encounter (HOSPITAL_COMMUNITY): Payer: Self-pay

## 2017-01-12 ENCOUNTER — Encounter (HOSPITAL_COMMUNITY)
Admission: RE | Admit: 2017-01-12 | Discharge: 2017-01-12 | Disposition: A | Discharge status: Home / Self Care | Payer: BLUE CROSS/BLUE SHIELD | Source: Ambulatory Visit | Attending: Adult Health | Admitting: Adult Health

## 2017-01-12 ENCOUNTER — Encounter (HOSPITAL_BASED_OUTPATIENT_CLINIC_OR_DEPARTMENT_OTHER)
Admission: RE | Admit: 2017-01-12 | Discharge: 2017-01-12 | Disposition: A | Discharge status: Home / Self Care | Payer: BLUE CROSS/BLUE SHIELD | Source: Ambulatory Visit | Attending: Adult Health | Admitting: Adult Health

## 2017-01-12 DIAGNOSIS — R079 Chest pain, unspecified: Secondary | ICD-10-CM | POA: Diagnosis not present

## 2017-01-12 DIAGNOSIS — I5030 Unspecified diastolic (congestive) heart failure: Secondary | ICD-10-CM | POA: Insufficient documentation

## 2017-01-12 DIAGNOSIS — I10 Essential (primary) hypertension: Secondary | ICD-10-CM | POA: Diagnosis not present

## 2017-01-12 LAB — NM MYOCAR MULTI W/SPECT W/WALL MOTION / EF
CHL CUP NUCLEAR SDS: 3
CSEPPHR: 139 {beats}/min
Estimated workload: 7 METS
Exercise duration (min): 6 min
Exercise duration (sec): 0 s
LV sys vol: 40 mL
LVDIAVOL: 91 mL (ref 62–150)
MPHR: 159 {beats}/min
NUC STRESS TID: 0.92
Percent HR: 87 %
RATE: 0.3
RPE: 15
Rest HR: 81 {beats}/min
SRS: 1
SSS: 4

## 2017-01-12 MED ORDER — TECHNETIUM TC 99M TETROFOSMIN IV KIT
30.0000 | PACK | Freq: Once | INTRAVENOUS | Status: AC | PRN
  Administered 2017-01-12: 30 via INTRAVENOUS

## 2017-01-12 MED ORDER — TECHNETIUM TC 99M TETROFOSMIN IV KIT
10.0000 | PACK | Freq: Once | INTRAVENOUS | Status: AC | PRN
  Administered 2017-01-12: 10 via INTRAVENOUS

## 2017-01-12 MED ORDER — SODIUM CHLORIDE 0.9% FLUSH
INTRAVENOUS | Status: AC
  Filled 2017-01-12: qty 10

## 2017-01-12 MED ORDER — REGADENOSON 0.4 MG/5ML IV SOLN
INTRAVENOUS | Status: AC
  Filled 2017-01-12: qty 5

## 2017-01-12 NOTE — Progress Notes (Signed)
*  PRELIMINARY RESULTS* Echocardiogram 2D Echocardiogram has been performed.  Kevin [name] 01/12/2017, 12:14 PM

## 2017-01-15 ENCOUNTER — Telehealth: Payer: Self-pay | Admitting: *Deleted

## 2017-01-15 NOTE — Telephone Encounter (Signed)
-----   Message from Lendon Colonel, NP sent at 01/12/2017  4:43 PM EDT ----- Echocardiogram shows normal heart function with some mild stiffening on relaxation. No changes in his medications.

## 2017-01-15 NOTE — Telephone Encounter (Signed)
Called patient with test results. No answer. Left message to call back.  

## 2017-01-19 ENCOUNTER — Ambulatory Visit (AMBULATORY_SURGERY_CENTER): Payer: Self-pay | Admitting: *Deleted

## 2017-01-19 VITALS — Ht 74.0 in | Wt 241.4 lb

## 2017-01-19 DIAGNOSIS — Z86010 Personal history of colonic polyps: Secondary | ICD-10-CM

## 2017-01-19 MED ORDER — NA SULFATE-K SULFATE-MG SULF 17.5-3.13-1.6 GM/177ML PO SOLN: 1.0000 | Freq: Once | ORAL | 0 refills | Status: AC

## 2017-01-19 NOTE — Progress Notes (Signed)
Denies allergies to eggs or soy products. Denies complications with sedation or anesthesia. Denies O2 use. Denies use of diet or weight loss medications.  Emmi instructions given for colonoscopy.  

## 2017-01-24 ENCOUNTER — Encounter: Payer: Self-pay | Admitting: Gastroenterology

## 2017-01-26 ENCOUNTER — Ambulatory Visit (INDEPENDENT_AMBULATORY_CARE_PROVIDER_SITE_OTHER): Payer: BLUE CROSS/BLUE SHIELD | Admitting: Adult Health

## 2017-01-26 ENCOUNTER — Encounter: Payer: Self-pay | Admitting: Adult Health

## 2017-01-26 VITALS — BP 116/76 | HR 74 | Ht 74.0 in | Wt 243.0 lb

## 2017-01-26 DIAGNOSIS — I10 Essential (primary) hypertension: Secondary | ICD-10-CM

## 2017-01-26 DIAGNOSIS — R0600 Dyspnea, unspecified: Secondary | ICD-10-CM | POA: Diagnosis not present

## 2017-01-26 DIAGNOSIS — R079 Chest pain, unspecified: Secondary | ICD-10-CM

## 2017-01-26 NOTE — Progress Notes (Signed)
Cardiology Office Note   Date:  01/26/2017   ID:  Kevin [name], DOB 08-25-1955, MRN [id number]  PCP:  Celene Squibb, MD  Cardiologist: Carlyle Dolly, MD  Chief Complaint  Patient presents with  . Follow-up  . Congestive Heart Failure    Diastolic      History of Present Illness: Kevin [name] is a 61 y.o. male who presents for ongoing assessment and management of chronic dyspnea, hypertension, grade 2 diastolic dysfunction, ongoing tobacco abuse.  The patient was last seen in the office on 01/01/2017 with complaints of dyspnea and chest pressure.  The patient had not been seen in the office since 2015.  He also complained about significant fatigue over the last several months.  The chest discomfort had typical and atypical features.  An echocardiogram was ordered for changes in LV function in the setting of worsening fatigue and dyspnea.  Nuclear medicine study was also ordered.  At the time of the office visit he was hypertensive but as this was the first visit we did not make any changes.  More changes would be made depending upon test results and follow-up blood pressures.  Study Result    No diagnostic ST segment changes indicate ischemia. Hypertensive response. No arrhythmias. No chest pain reported.Low risk Duke treadmill score of 6.  Small, mild intensity, inferolateral defect that is predominantly fixed with a very small degree of partial reversibility at the base. Likely reflects variable soft tissue attenuation, however not cannot completely exclude a very small degree of ischemia.  This is a low risk study.  Nuclear stress EF: 56%.   Echocardiogram 01/12/2017  - Left ventricle: The cavity size was normal. Wall thickness was   increased in a pattern of mild LVH. Systolic function was normal.   The estimated ejection fraction was in the range of 55% to 60%.   Wall motion was normal; there were no regional wall motion   abnormalities. Doppler parameters are  consistent with abnormal   left ventricular relaxation (grade 1 diastolic dysfunction). - Mitral valve: Mildly calcified annulus. There was trivial   regurgitation. - Right atrium: Central venous pressure (est): 3 mm Hg. - Atrial septum: No defect or patent foramen ovale was identified. - Tricuspid valve: There was trivial regurgitation. - Pericardium, extracardiac: There was no pericardial effusion.  Impressions:   Mild LVH with LVEF 55-60%. Grade 1 diastolic dysfunction. Mildly   calcified mitral annulus with trivial mitral regurgitation.   Trivial tricuspid regurgitation.  He comes today without any further complaints.  He has been seen by his primary care physician, and has been prescribed Abreva.  He states that it is helped "some", but he still feels sluggish.  He denies chest pain palpitations, or dizziness.    Past Medical History:  Diagnosis Date  . Hyperlipidemia   . Hypertension   . Restless legs syndrome (RLS)   . Seasonal allergies   . Smoker    Quit 08/04/12  . Ulcer 1988    Past Surgical History:  Procedure Laterality Date  . APPENDECTOMY  1957   hernia repair at the same time  . [place]  2000, 2002, 2007   bilateral  2x's on right and 1x on the left  . TONSILECTOMY, ADENOIDECTOMY, BILATERAL MYRINGOTOMY AND TUBES  1965     Current Outpatient Prescriptions  Medication Sig Dispense Refill  . aspirin 81 MG tablet Take 81 mg by mouth daily.    . Cholecalciferol (VITAMIN D3) 5000 units TABS Take  by mouth.    . furosemide (LASIX) 40 MG tablet Take 1 tablet (40 mg total) by mouth daily. 15 tablet 0  . montelukast (SINGULAIR) 10 MG tablet Take 10 mg by mouth at bedtime.    . Multiple Vitamin (MULTIVITAMIN) tablet Take 1 tablet by mouth daily.    . Omega-3 Fatty Acids (FISH OIL) 1000 MG CAPS Take by mouth.    . potassium chloride 20 MEQ TBCR Take 20 mEq by mouth daily with breakfast. 15 tablet 0  . pravastatin (PRAVACHOL) 40 MG tablet Take 40 mg by  mouth daily.    Marland Kitchen telmisartan (MICARDIS) 80 MG tablet     . zolpidem (AMBIEN) 10 MG tablet Take 10 mg by mouth at bedtime as needed for sleep. 1/2 tab as needed     No current facility-administered medications for this visit.     Allergies:   Patient has no known allergies.    Social History:  The patient  reports that he quit smoking about 6 months ago. His smoking use included Cigarettes and Cigars. He smoked 0.50 packs per day. He has never used smokeless tobacco. He reports that he drinks about 13.2 oz of alcohol per week . He reports that he does not use drugs.   Family History:  The patient's family history includes Colon cancer in his cousin and paternal aunt; Colon polyps in his brother; Lung cancer (age of onset: 109) in his father.    ROS: All other systems are reviewed and negative. Unless otherwise mentioned in H&P    PHYSICAL EXAM: VS:  BP 116/76   Pulse 74   Ht 6\' 2"  (1.88 m)   Wt 243 lb (110.2 kg)   SpO2 97%   BMI 31.20 kg/m  , BMI Body mass index is 31.2 kg/m. GEN: Well nourished, well developed, in no acute distress  HEENT: normal  Neck: no JVD, carotid bruits, or masses Cardiac: RRR; no murmurs, rubs, or gallops,no edema  Respiratory:  clear to auscultation bilaterally, normal work of breathing GI: soft, nontender, nondistended, + BS MS: no deformity or atrophy  Skin: warm and dry, no rash Neuro:  Strength and sensation are intact Psych: euthymic mood, full affect   Recent Labs: No results found for requested labs within last 8760 hours.   Wt Readings from Last 3 Encounters:  01/26/17 243 lb (110.2 kg)  01/19/17 241 lb 6.4 oz (109.5 kg)  01/01/17 238 lb (108 kg)     ASSESSMENT AND PLAN:  1.  Hypertension: Blood pressure is well controlled currently.  We will not make any changes in his medication regimen at this time.  Prior blood pressure was elevated on first visit but it is normalized now.  Echocardiogram revealed grade 1 diastolic dysfunction, I  have reviewed this with the patient and advised him to keep his blood pressure under control.  He verbalizes understanding.  Otherwise echocardiogram revealed normal EF with no wall motion abnormalities.  2.  Chest pressure: Stress Myoview was low risk.  He states the chest pressure has improved with use of Abreva.  3.  Dyspnea: Some improvement with inhaler.  He has been referred to lobe our pulmonology and is awaiting appointment.   Current medicines are reviewed at length with the patient today.    Labs/ tests ordered today include: None  Phill Myron. West Pugh, ANP, AACC   01/26/2017 4:27 PM    [facility] [address] [place], [place] [address] Phone: [phone]; Fax: [phone]  [phone] 

## 2017-01-26 NOTE — Patient Instructions (Signed)
Medication Instructions:  Your physician recommends that you continue on your current medications as directed. Please refer to the Current Medication list given to you today.   Labwork: NONE   Testing/Procedures: NONE   Follow-Up: Your physician wants you to follow-up in: 6 months. You will receive a reminder letter in the mail two months in advance. If you don't receive a letter, please call our office to schedule the follow-up appointment.   Any Other Special Instructions Will Be Listed Below (If Applicable).     If you need a refill on your cardiac medications before your next appointment, please call your pharmacy.  Thank you for choosing [facility]!

## 2017-02-02 ENCOUNTER — Other Ambulatory Visit: Payer: Self-pay

## 2017-02-02 ENCOUNTER — Ambulatory Visit (AMBULATORY_SURGERY_CENTER): Payer: BLUE CROSS/BLUE SHIELD | Admitting: Gastroenterology

## 2017-02-02 ENCOUNTER — Encounter: Payer: Self-pay | Admitting: Gastroenterology

## 2017-02-02 VITALS — BP 129/81 | HR 70 | Temp 98.6°F | Resp 17 | Ht 74.0 in | Wt 241.0 lb

## 2017-02-02 DIAGNOSIS — Z86010 Personal history of colonic polyps: Secondary | ICD-10-CM | POA: Diagnosis present

## 2017-02-02 DIAGNOSIS — D128 Benign neoplasm of rectum: Secondary | ICD-10-CM

## 2017-02-02 DIAGNOSIS — Z1211 Encounter for screening for malignant neoplasm of colon: Secondary | ICD-10-CM | POA: Diagnosis not present

## 2017-02-02 MED ORDER — SODIUM CHLORIDE 0.9 % IV SOLN: 500.0000 mL | INTRAVENOUS | Status: DC

## 2017-02-02 NOTE — Progress Notes (Signed)
Called to room to assist during endoscopic procedure.  Patient ID and intended procedure confirmed with present staff. Received instructions for my participation in the procedure from the performing physician.  

## 2017-02-02 NOTE — Progress Notes (Signed)
A and O x3. Report to RN. Tolerated MAC anesthesia well.

## 2017-02-02 NOTE — Progress Notes (Signed)
Pt. Reports no change in his medical or surgical history since pre-visit 01/19/2017.

## 2017-02-02 NOTE — Patient Instructions (Signed)
Discharge instructions given. Handouts on polyps and diverticulosis. Resume previous medications. YOU HAD AN ENDOSCOPIC PROCEDURE TODAY AT THE [facility]:   Refer to the procedure report that was given to you for any specific questions about what was found during the examination.  If the procedure report does not answer your questions, please call your gastroenterologist to clarify.  If you requested that your care partner not be given the details of your procedure findings, then the procedure report has been included in a sealed envelope for you to review at your convenience later.  YOU SHOULD EXPECT: Some feelings of bloating in the abdomen. Passage of more gas than usual.  Walking can help get rid of the air that was put into your GI tract during the procedure and reduce the bloating. If you had a lower endoscopy (such as a colonoscopy or flexible sigmoidoscopy) you may notice spotting of blood in your stool or on the toilet paper. If you underwent a bowel prep for your procedure, you may not have a normal bowel movement for a few days.  Please Note:  You might notice some irritation and congestion in your nose or some drainage.  This is from the oxygen used during your procedure.  There is no need for concern and it should clear up in a day or so.  SYMPTOMS TO REPORT IMMEDIATELY:   Following lower endoscopy (colonoscopy or flexible sigmoidoscopy):  Excessive amounts of blood in the stool  Significant tenderness or worsening of abdominal pains  Swelling of the abdomen that is new, acute  Fever of 100F or higher   For urgent or emergent issues, a gastroenterologist can be reached at any hour by calling [phone].   DIET:  We do recommend a small meal at first, but then you may proceed to your regular diet.  Drink plenty of fluids but you should avoid alcoholic beverages for 24 hours.  ACTIVITY:  You should plan to take it easy for the rest of today and you should NOT  DRIVE or use heavy machinery until tomorrow (because of the sedation medicines used during the test).    FOLLOW UP: Our staff will call the number listed on your records the next business day following your procedure to check on you and address any questions or concerns that you may have regarding the information given to you following your procedure. If we do not reach you, we will leave a message.  However, if you are feeling well and you are not experiencing any problems, there is no need to return our call.  We will assume that you have returned to your regular daily activities without incident.  If any biopsies were taken you will be contacted by phone or by letter within the next 1-3 weeks.  Please call us at [phone] if you have not heard about the biopsies in 3 weeks.    SIGNATURES/CONFIDENTIALITY: You and/or your care partner have signed paperwork which will be entered into your electronic medical record.  These signatures attest to the fact that that the information above on your After Visit Summary has been reviewed and is understood.  Full responsibility of the confidentiality of this discharge information lies with you and/or your care-partner. 

## 2017-02-02 NOTE — Op Note (Signed)
[facility] Patient Name: Kevin [name] Procedure Date: 02/02/2017 12:17 PM MRN: [id number] Endoscopist: Mallie Mussel L. Loletha Carrow , MD Age: 61 Referring MD:  Date of Birth: [birth year] Gender: Male Account #: [id number] Procedure:                Colonoscopy Indications:              High risk colon cancer surveillance: Personal                            history of adenoma less than 10 mm in size (TA x 2                            08/2012) Medicines:                Monitored Anesthesia Care Procedure:                Pre-Anesthesia Assessment:                           - Prior to the procedure, a History and Physical                            was performed, and patient medications and                            allergies were reviewed. The patient's tolerance of                            previous anesthesia was also reviewed. The risks                            and benefits of the procedure and the sedation                            options and risks were discussed with the patient.                            All questions were answered, and informed consent                            was obtained. Anticoagulants: The patient has taken                            aspirin. It was decided not to withhold this                            medication prior to the procedure. ASA Grade                            Assessment: II - A patient with mild systemic                            disease. After reviewing the risks and benefits,  the patient was deemed in satisfactory condition to                            undergo the procedure.                           After obtaining informed consent, the colonoscope                            was passed under direct vision. Throughout the                            procedure, the patient's blood pressure, pulse, and                            oxygen saturations were monitored continuously. The   Colonoscope was introduced through the anus and                            advanced to the the cecum, identified by                            appendiceal orifice and ileocecal valve. The                            colonoscopy was performed without difficulty. The                            patient tolerated the procedure well. The quality                            of the bowel preparation was excellent. The                            ileocecal valve, appendiceal orifice, and rectum                            were photographed. The quality of the bowel                            preparation was evaluated using the BBPS [facility]                            Bowel Preparation Scale) with scores of: Right                            Colon = 3 and Transverse Colon = 3. The total BBPS                            score equals 6. After lavage. The bowel preparation                            used was SUPREP. Scope In: 12:24:26 PM Scope Out: 12:37:21 PM Scope Withdrawal Time: 0 hours 10  minutes 58 seconds  Total Procedure Duration: 0 hours 12 minutes 55 seconds  Findings:                 The perianal and digital rectal examinations were                            normal.                           Multiple diverticula were found in the entire colon.                           A 4 mm polyp was found in the rectum. The polyp was                            sessile. The polyp was removed with a piecemeal                            technique using a cold biopsy forceps. Resection                            and retrieval were complete.                           The exam was otherwise without abnormality on                            direct and retroflexion views. Complications:            No immediate complications. Estimated Blood Loss:     Estimated blood loss was minimal. Impression:               - Diverticulosis in the entire examined colon.                           - One 4 mm polyp in the rectum,  removed piecemeal                            using a cold biopsy forceps. Resected and retrieved.                           - The examination was otherwise normal on direct                            and retroflexion views. Recommendation:           - Patient has a contact number available for                            emergencies. The signs and symptoms of potential                            delayed complications were discussed with the  patient. Return to normal activities tomorrow.                            Written discharge instructions were provided to the                            patient.                           - Resume previous diet.                           - Continue present medications.                           - Await pathology results.                           - Repeat colonoscopy is recommended for                            surveillance. The colonoscopy date will be                            determined after pathology results from today's                            exam become available for review. Henry L. Loletha Carrow, MD 02/02/2017 12:42:32 PM This report has been signed electronically.

## 2017-02-05 ENCOUNTER — Telehealth: Payer: Self-pay | Admitting: *Deleted

## 2017-02-05 ENCOUNTER — Telehealth: Payer: Self-pay

## 2017-02-05 NOTE — Telephone Encounter (Signed)
  Follow up Call-  Call back number 02/02/2017  Post procedure Call Back phone  # [phone]  Permission to leave phone message Yes  Some recent data might be hidden     Patient questions:  Do you have a fever, pain , or abdominal swelling? No. Pain Score  0 *  Have you tolerated food without any problems? Yes.    Have you been able to return to your normal activities? Yes.    Do you have any questions about your discharge instructions: Diet   No. Medications  No. Follow up visit  No.  Do you have questions or concerns about your Care? No.  Actions: * If pain score is 4 or above: No action needed, pain <4.

## 2017-02-05 NOTE — Telephone Encounter (Signed)
Left message on answering machine. 

## 2017-02-08 ENCOUNTER — Encounter: Payer: Self-pay | Admitting: Gastroenterology

## 2017-03-07 DIAGNOSIS — M545 Low back pain: Secondary | ICD-10-CM | POA: Diagnosis not present

## 2017-03-07 DIAGNOSIS — I10 Essential (primary) hypertension: Secondary | ICD-10-CM | POA: Diagnosis not present

## 2017-03-07 DIAGNOSIS — M9902 Segmental and somatic dysfunction of thoracic region: Secondary | ICD-10-CM | POA: Diagnosis not present

## 2017-03-07 DIAGNOSIS — M9903 Segmental and somatic dysfunction of lumbar region: Secondary | ICD-10-CM | POA: Diagnosis not present

## 2017-03-07 DIAGNOSIS — R7301 Impaired fasting glucose: Secondary | ICD-10-CM | POA: Diagnosis not present

## 2017-03-07 DIAGNOSIS — E782 Mixed hyperlipidemia: Secondary | ICD-10-CM | POA: Diagnosis not present

## 2017-03-07 DIAGNOSIS — M9905 Segmental and somatic dysfunction of pelvic region: Secondary | ICD-10-CM | POA: Diagnosis not present

## 2017-03-09 DIAGNOSIS — I10 Essential (primary) hypertension: Secondary | ICD-10-CM | POA: Diagnosis not present

## 2017-06-26 DIAGNOSIS — J301 Allergic rhinitis due to pollen: Secondary | ICD-10-CM | POA: Diagnosis not present

## 2017-06-26 DIAGNOSIS — H906 Mixed conductive and sensorineural hearing loss, bilateral: Secondary | ICD-10-CM | POA: Diagnosis not present

## 2017-06-26 DIAGNOSIS — H748X3 Other specified disorders of middle ear and mastoid, bilateral: Secondary | ICD-10-CM | POA: Diagnosis not present

## 2017-06-26 DIAGNOSIS — H9211 Otorrhea, right ear: Secondary | ICD-10-CM | POA: Diagnosis not present

## 2017-07-11 DIAGNOSIS — E669 Obesity, unspecified: Secondary | ICD-10-CM | POA: Diagnosis not present

## 2017-07-11 DIAGNOSIS — Z6832 Body mass index (BMI) 32.0-32.9, adult: Secondary | ICD-10-CM | POA: Diagnosis not present

## 2017-07-11 DIAGNOSIS — E782 Mixed hyperlipidemia: Secondary | ICD-10-CM | POA: Diagnosis not present

## 2017-07-11 DIAGNOSIS — Z87891 Personal history of nicotine dependence: Secondary | ICD-10-CM | POA: Diagnosis not present

## 2017-07-11 DIAGNOSIS — G4700 Insomnia, unspecified: Secondary | ICD-10-CM | POA: Diagnosis not present

## 2017-07-11 DIAGNOSIS — I10 Essential (primary) hypertension: Secondary | ICD-10-CM | POA: Diagnosis not present

## 2017-07-11 DIAGNOSIS — E119 Type 2 diabetes mellitus without complications: Secondary | ICD-10-CM | POA: Diagnosis not present

## 2017-07-12 DIAGNOSIS — F5101 Primary insomnia: Secondary | ICD-10-CM | POA: Diagnosis not present

## 2017-07-12 DIAGNOSIS — E1165 Type 2 diabetes mellitus with hyperglycemia: Secondary | ICD-10-CM | POA: Diagnosis not present

## 2017-07-12 DIAGNOSIS — H748X3 Other specified disorders of middle ear and mastoid, bilateral: Secondary | ICD-10-CM | POA: Diagnosis not present

## 2017-07-12 DIAGNOSIS — E782 Mixed hyperlipidemia: Secondary | ICD-10-CM | POA: Diagnosis not present

## 2017-07-12 DIAGNOSIS — H906 Mixed conductive and sensorineural hearing loss, bilateral: Secondary | ICD-10-CM | POA: Diagnosis not present

## 2017-07-12 DIAGNOSIS — I10 Essential (primary) hypertension: Secondary | ICD-10-CM | POA: Diagnosis not present

## 2017-08-06 DIAGNOSIS — J060 Acute laryngopharyngitis: Secondary | ICD-10-CM | POA: Diagnosis not present

## 2017-12-24 DIAGNOSIS — J0100 Acute maxillary sinusitis, unspecified: Secondary | ICD-10-CM | POA: Diagnosis not present

## 2017-12-24 DIAGNOSIS — H748X1 Other specified disorders of right middle ear and mastoid: Secondary | ICD-10-CM | POA: Diagnosis not present

## 2017-12-24 DIAGNOSIS — H906 Mixed conductive and sensorineural hearing loss, bilateral: Secondary | ICD-10-CM | POA: Diagnosis not present

## 2017-12-24 DIAGNOSIS — H6982 Other specified disorders of Eustachian tube, left ear: Secondary | ICD-10-CM | POA: Diagnosis not present

## 2018-01-09 DIAGNOSIS — E782 Mixed hyperlipidemia: Secondary | ICD-10-CM | POA: Diagnosis not present

## 2018-01-09 DIAGNOSIS — E1165 Type 2 diabetes mellitus with hyperglycemia: Secondary | ICD-10-CM | POA: Diagnosis not present

## 2018-01-09 DIAGNOSIS — Z125 Encounter for screening for malignant neoplasm of prostate: Secondary | ICD-10-CM | POA: Diagnosis not present

## 2018-01-09 DIAGNOSIS — Z87891 Personal history of nicotine dependence: Secondary | ICD-10-CM | POA: Diagnosis not present

## 2018-01-09 DIAGNOSIS — G4700 Insomnia, unspecified: Secondary | ICD-10-CM | POA: Diagnosis not present

## 2018-01-09 DIAGNOSIS — Z6832 Body mass index (BMI) 32.0-32.9, adult: Secondary | ICD-10-CM | POA: Diagnosis not present

## 2018-01-09 DIAGNOSIS — I10 Essential (primary) hypertension: Secondary | ICD-10-CM | POA: Diagnosis not present

## 2018-01-09 DIAGNOSIS — E119 Type 2 diabetes mellitus without complications: Secondary | ICD-10-CM | POA: Diagnosis not present

## 2018-01-10 DIAGNOSIS — H748X3 Other specified disorders of middle ear and mastoid, bilateral: Secondary | ICD-10-CM | POA: Diagnosis not present

## 2018-01-10 DIAGNOSIS — H906 Mixed conductive and sensorineural hearing loss, bilateral: Secondary | ICD-10-CM | POA: Diagnosis not present

## 2018-01-10 DIAGNOSIS — H6983 Other specified disorders of Eustachian tube, bilateral: Secondary | ICD-10-CM | POA: Diagnosis not present

## 2018-01-11 DIAGNOSIS — Z23 Encounter for immunization: Secondary | ICD-10-CM | POA: Diagnosis not present

## 2018-01-11 DIAGNOSIS — E1169 Type 2 diabetes mellitus with other specified complication: Secondary | ICD-10-CM | POA: Diagnosis not present

## 2018-01-11 DIAGNOSIS — Z0000 Encounter for general adult medical examination without abnormal findings: Secondary | ICD-10-CM | POA: Diagnosis not present

## 2018-01-11 DIAGNOSIS — I10 Essential (primary) hypertension: Secondary | ICD-10-CM | POA: Diagnosis not present

## 2018-01-11 DIAGNOSIS — E782 Mixed hyperlipidemia: Secondary | ICD-10-CM | POA: Diagnosis not present

## 2018-04-02 DIAGNOSIS — R197 Diarrhea, unspecified: Secondary | ICD-10-CM | POA: Diagnosis not present

## 2018-04-02 DIAGNOSIS — R1030 Lower abdominal pain, unspecified: Secondary | ICD-10-CM | POA: Diagnosis not present

## 2018-07-03 DIAGNOSIS — E119 Type 2 diabetes mellitus without complications: Secondary | ICD-10-CM | POA: Diagnosis not present

## 2018-07-03 DIAGNOSIS — Z6832 Body mass index (BMI) 32.0-32.9, adult: Secondary | ICD-10-CM | POA: Diagnosis not present

## 2018-07-03 DIAGNOSIS — Z87891 Personal history of nicotine dependence: Secondary | ICD-10-CM | POA: Diagnosis not present

## 2018-07-03 DIAGNOSIS — E1165 Type 2 diabetes mellitus with hyperglycemia: Secondary | ICD-10-CM | POA: Diagnosis not present

## 2018-07-03 DIAGNOSIS — I10 Essential (primary) hypertension: Secondary | ICD-10-CM | POA: Diagnosis not present

## 2018-07-03 DIAGNOSIS — G4700 Insomnia, unspecified: Secondary | ICD-10-CM | POA: Diagnosis not present

## 2018-07-03 DIAGNOSIS — E782 Mixed hyperlipidemia: Secondary | ICD-10-CM | POA: Diagnosis not present

## 2018-07-12 DIAGNOSIS — I10 Essential (primary) hypertension: Secondary | ICD-10-CM | POA: Diagnosis not present

## 2018-07-12 DIAGNOSIS — E1169 Type 2 diabetes mellitus with other specified complication: Secondary | ICD-10-CM | POA: Diagnosis not present

## 2018-07-12 DIAGNOSIS — G4700 Insomnia, unspecified: Secondary | ICD-10-CM | POA: Diagnosis not present

## 2018-07-12 DIAGNOSIS — E782 Mixed hyperlipidemia: Secondary | ICD-10-CM | POA: Diagnosis not present

## 2018-08-08 DIAGNOSIS — H6983 Other specified disorders of Eustachian tube, bilateral: Secondary | ICD-10-CM | POA: Diagnosis not present

## 2018-08-08 DIAGNOSIS — J301 Allergic rhinitis due to pollen: Secondary | ICD-10-CM | POA: Diagnosis not present

## 2018-08-08 DIAGNOSIS — H748X3 Other specified disorders of middle ear and mastoid, bilateral: Secondary | ICD-10-CM | POA: Diagnosis not present

## 2018-08-08 DIAGNOSIS — J0100 Acute maxillary sinusitis, unspecified: Secondary | ICD-10-CM | POA: Diagnosis not present

## 2018-09-10 DIAGNOSIS — R51 Headache: Secondary | ICD-10-CM | POA: Diagnosis not present

## 2018-09-10 DIAGNOSIS — R21 Rash and other nonspecific skin eruption: Secondary | ICD-10-CM | POA: Diagnosis not present

## 2018-09-13 DIAGNOSIS — Z87891 Personal history of nicotine dependence: Secondary | ICD-10-CM | POA: Diagnosis not present

## 2018-09-13 DIAGNOSIS — R42 Dizziness and giddiness: Secondary | ICD-10-CM | POA: Diagnosis not present

## 2018-09-13 DIAGNOSIS — Z6832 Body mass index (BMI) 32.0-32.9, adult: Secondary | ICD-10-CM | POA: Diagnosis not present

## 2018-09-13 DIAGNOSIS — R21 Rash and other nonspecific skin eruption: Secondary | ICD-10-CM | POA: Diagnosis not present

## 2018-09-13 DIAGNOSIS — G4700 Insomnia, unspecified: Secondary | ICD-10-CM | POA: Diagnosis not present

## 2018-09-13 DIAGNOSIS — Z86010 Personal history of colonic polyps: Secondary | ICD-10-CM | POA: Diagnosis not present

## 2018-09-24 DIAGNOSIS — R270 Ataxia, unspecified: Secondary | ICD-10-CM | POA: Diagnosis not present

## 2018-09-25 ENCOUNTER — Other Ambulatory Visit (HOSPITAL_COMMUNITY): Payer: Self-pay | Admitting: Family Medicine

## 2018-09-25 ENCOUNTER — Other Ambulatory Visit: Payer: Self-pay | Admitting: Family Medicine

## 2018-09-25 DIAGNOSIS — R42 Dizziness and giddiness: Secondary | ICD-10-CM

## 2018-09-30 ENCOUNTER — Encounter: Payer: Self-pay | Admitting: Neurology

## 2018-10-04 ENCOUNTER — Other Ambulatory Visit: Payer: Self-pay | Admitting: Otolaryngology

## 2018-10-04 DIAGNOSIS — R270 Ataxia, unspecified: Secondary | ICD-10-CM

## 2018-10-08 ENCOUNTER — Telehealth: Payer: Self-pay | Admitting: Neurology

## 2018-10-08 NOTE — Telephone Encounter (Signed)
New Message  Patient verbalized he made an ear appt for ENT due to him being off, balance issues and have gotten progressively worse and ENT referred patient here.   Patient verbalized this has been happening for three to four weeks like this and not as bad as he is feeling now for a few months and he is needing advice on what to do.  Please f/u with patient

## 2018-10-09 NOTE — Telephone Encounter (Signed)
Did you call and let him know that?

## 2018-10-09 NOTE — Telephone Encounter (Signed)
Called and advised Pt we are unable to give medical advice until he is seen, and that he should direct any questions to his PCP. I reminded him of his 10/25/18 consultation with our office.

## 2018-10-11 DIAGNOSIS — R0602 Shortness of breath: Secondary | ICD-10-CM | POA: Diagnosis not present

## 2018-10-11 DIAGNOSIS — R51 Headache: Secondary | ICD-10-CM | POA: Diagnosis not present

## 2018-10-17 DIAGNOSIS — D332 Benign neoplasm of brain, unspecified: Secondary | ICD-10-CM | POA: Diagnosis not present

## 2018-10-17 DIAGNOSIS — K7689 Other specified diseases of liver: Secondary | ICD-10-CM | POA: Diagnosis not present

## 2018-10-17 DIAGNOSIS — N200 Calculus of kidney: Secondary | ICD-10-CM | POA: Diagnosis not present

## 2018-10-17 DIAGNOSIS — R900 Intracranial space-occupying lesion found on diagnostic imaging of central nervous system: Secondary | ICD-10-CM | POA: Diagnosis not present

## 2018-10-17 DIAGNOSIS — R911 Solitary pulmonary nodule: Secondary | ICD-10-CM | POA: Diagnosis not present

## 2018-10-17 DIAGNOSIS — K5730 Diverticulosis of large intestine without perforation or abscess without bleeding: Secondary | ICD-10-CM | POA: Diagnosis not present

## 2018-10-17 DIAGNOSIS — R51 Headache: Secondary | ICD-10-CM | POA: Diagnosis not present

## 2018-10-17 DIAGNOSIS — R220 Localized swelling, mass and lump, head: Secondary | ICD-10-CM | POA: Diagnosis not present

## 2018-10-18 DIAGNOSIS — C3490 Malignant neoplasm of unspecified part of unspecified bronchus or lung: Secondary | ICD-10-CM | POA: Diagnosis not present

## 2018-10-18 DIAGNOSIS — D496 Neoplasm of unspecified behavior of brain: Secondary | ICD-10-CM | POA: Diagnosis not present

## 2018-10-18 DIAGNOSIS — I110 Hypertensive heart disease with heart failure: Secondary | ICD-10-CM | POA: Diagnosis not present

## 2018-10-18 DIAGNOSIS — K5730 Diverticulosis of large intestine without perforation or abscess without bleeding: Secondary | ICD-10-CM | POA: Diagnosis not present

## 2018-10-18 DIAGNOSIS — D332 Benign neoplasm of brain, unspecified: Secondary | ICD-10-CM | POA: Diagnosis not present

## 2018-10-18 DIAGNOSIS — R918 Other nonspecific abnormal finding of lung field: Secondary | ICD-10-CM | POA: Diagnosis not present

## 2018-10-18 DIAGNOSIS — C7931 Secondary malignant neoplasm of brain: Secondary | ICD-10-CM | POA: Diagnosis not present

## 2018-10-18 DIAGNOSIS — Z1159 Encounter for screening for other viral diseases: Secondary | ICD-10-CM | POA: Diagnosis not present

## 2018-10-18 DIAGNOSIS — F17210 Nicotine dependence, cigarettes, uncomplicated: Secondary | ICD-10-CM | POA: Diagnosis not present

## 2018-10-18 DIAGNOSIS — E785 Hyperlipidemia, unspecified: Secondary | ICD-10-CM | POA: Diagnosis not present

## 2018-10-18 DIAGNOSIS — R911 Solitary pulmonary nodule: Secondary | ICD-10-CM | POA: Diagnosis not present

## 2018-10-18 DIAGNOSIS — N200 Calculus of kidney: Secondary | ICD-10-CM | POA: Diagnosis not present

## 2018-10-18 DIAGNOSIS — Z7984 Long term (current) use of oral hypoglycemic drugs: Secondary | ICD-10-CM | POA: Diagnosis not present

## 2018-10-18 DIAGNOSIS — I509 Heart failure, unspecified: Secondary | ICD-10-CM | POA: Diagnosis not present

## 2018-10-18 DIAGNOSIS — E1165 Type 2 diabetes mellitus with hyperglycemia: Secondary | ICD-10-CM | POA: Diagnosis not present

## 2018-10-18 DIAGNOSIS — K7689 Other specified diseases of liver: Secondary | ICD-10-CM | POA: Diagnosis not present

## 2018-10-18 DIAGNOSIS — Z79899 Other long term (current) drug therapy: Secondary | ICD-10-CM | POA: Diagnosis not present

## 2018-10-22 DIAGNOSIS — C719 Malignant neoplasm of brain, unspecified: Secondary | ICD-10-CM | POA: Diagnosis not present

## 2018-10-22 DIAGNOSIS — C801 Malignant (primary) neoplasm, unspecified: Secondary | ICD-10-CM | POA: Diagnosis not present

## 2018-10-22 DIAGNOSIS — C713 Malignant neoplasm of parietal lobe: Secondary | ICD-10-CM | POA: Diagnosis not present

## 2018-10-22 DIAGNOSIS — E119 Type 2 diabetes mellitus without complications: Secondary | ICD-10-CM | POA: Diagnosis not present

## 2018-10-22 DIAGNOSIS — E785 Hyperlipidemia, unspecified: Secondary | ICD-10-CM | POA: Diagnosis not present

## 2018-10-22 DIAGNOSIS — F17200 Nicotine dependence, unspecified, uncomplicated: Secondary | ICD-10-CM | POA: Diagnosis not present

## 2018-10-22 DIAGNOSIS — D496 Neoplasm of unspecified behavior of brain: Secondary | ICD-10-CM | POA: Diagnosis not present

## 2018-10-22 DIAGNOSIS — C7931 Secondary malignant neoplasm of brain: Secondary | ICD-10-CM | POA: Diagnosis not present

## 2018-10-22 DIAGNOSIS — C782 Secondary malignant neoplasm of pleura: Secondary | ICD-10-CM | POA: Diagnosis not present

## 2018-10-22 DIAGNOSIS — I10 Essential (primary) hypertension: Secondary | ICD-10-CM | POA: Diagnosis not present

## 2018-10-23 ENCOUNTER — Ambulatory Visit (HOSPITAL_COMMUNITY): Payer: BC Managed Care – PPO

## 2018-10-25 ENCOUNTER — Ambulatory Visit: Payer: BLUE CROSS/BLUE SHIELD | Admitting: Neurology

## 2018-10-28 ENCOUNTER — Other Ambulatory Visit: Payer: BLUE CROSS/BLUE SHIELD

## 2018-11-08 DIAGNOSIS — C3412 Malignant neoplasm of upper lobe, left bronchus or lung: Secondary | ICD-10-CM | POA: Diagnosis not present

## 2018-11-08 DIAGNOSIS — E119 Type 2 diabetes mellitus without complications: Secondary | ICD-10-CM | POA: Diagnosis not present

## 2018-11-08 DIAGNOSIS — C7931 Secondary malignant neoplasm of brain: Secondary | ICD-10-CM | POA: Diagnosis not present

## 2018-11-08 DIAGNOSIS — F17210 Nicotine dependence, cigarettes, uncomplicated: Secondary | ICD-10-CM | POA: Diagnosis not present

## 2018-11-11 DIAGNOSIS — Z510 Encounter for antineoplastic radiation therapy: Secondary | ICD-10-CM | POA: Diagnosis not present

## 2018-11-11 DIAGNOSIS — C3412 Malignant neoplasm of upper lobe, left bronchus or lung: Secondary | ICD-10-CM | POA: Diagnosis not present

## 2018-11-11 DIAGNOSIS — C7931 Secondary malignant neoplasm of brain: Secondary | ICD-10-CM | POA: Diagnosis not present

## 2018-11-14 DIAGNOSIS — R609 Edema, unspecified: Secondary | ICD-10-CM | POA: Diagnosis not present

## 2018-11-19 DIAGNOSIS — C3490 Malignant neoplasm of unspecified part of unspecified bronchus or lung: Secondary | ICD-10-CM | POA: Diagnosis not present

## 2018-11-19 DIAGNOSIS — C7931 Secondary malignant neoplasm of brain: Secondary | ICD-10-CM | POA: Diagnosis not present

## 2018-11-19 DIAGNOSIS — K228 Other specified diseases of esophagus: Secondary | ICD-10-CM | POA: Diagnosis not present

## 2018-11-19 DIAGNOSIS — E041 Nontoxic single thyroid nodule: Secondary | ICD-10-CM | POA: Diagnosis not present

## 2018-11-19 DIAGNOSIS — C3412 Malignant neoplasm of upper lobe, left bronchus or lung: Secondary | ICD-10-CM | POA: Diagnosis not present

## 2018-11-19 DIAGNOSIS — N289 Disorder of kidney and ureter, unspecified: Secondary | ICD-10-CM | POA: Diagnosis not present

## 2018-11-20 DIAGNOSIS — C7931 Secondary malignant neoplasm of brain: Secondary | ICD-10-CM | POA: Diagnosis not present

## 2018-11-20 DIAGNOSIS — C3412 Malignant neoplasm of upper lobe, left bronchus or lung: Secondary | ICD-10-CM | POA: Diagnosis not present

## 2018-11-20 DIAGNOSIS — Z510 Encounter for antineoplastic radiation therapy: Secondary | ICD-10-CM | POA: Diagnosis not present

## 2018-11-21 DIAGNOSIS — C3412 Malignant neoplasm of upper lobe, left bronchus or lung: Secondary | ICD-10-CM | POA: Diagnosis not present

## 2018-11-21 DIAGNOSIS — C7931 Secondary malignant neoplasm of brain: Secondary | ICD-10-CM | POA: Diagnosis not present

## 2018-11-21 DIAGNOSIS — Z510 Encounter for antineoplastic radiation therapy: Secondary | ICD-10-CM | POA: Diagnosis not present

## 2018-11-21 DIAGNOSIS — C3490 Malignant neoplasm of unspecified part of unspecified bronchus or lung: Secondary | ICD-10-CM | POA: Diagnosis not present

## 2018-11-22 DIAGNOSIS — C7931 Secondary malignant neoplasm of brain: Secondary | ICD-10-CM | POA: Diagnosis not present

## 2018-11-22 DIAGNOSIS — C3412 Malignant neoplasm of upper lobe, left bronchus or lung: Secondary | ICD-10-CM | POA: Diagnosis not present

## 2018-11-22 DIAGNOSIS — Z510 Encounter for antineoplastic radiation therapy: Secondary | ICD-10-CM | POA: Diagnosis not present

## 2018-11-25 DIAGNOSIS — C7931 Secondary malignant neoplasm of brain: Secondary | ICD-10-CM | POA: Diagnosis not present

## 2018-11-25 DIAGNOSIS — C3412 Malignant neoplasm of upper lobe, left bronchus or lung: Secondary | ICD-10-CM | POA: Diagnosis not present

## 2018-11-25 DIAGNOSIS — Z510 Encounter for antineoplastic radiation therapy: Secondary | ICD-10-CM | POA: Diagnosis not present

## 2018-11-26 DIAGNOSIS — C3412 Malignant neoplasm of upper lobe, left bronchus or lung: Secondary | ICD-10-CM | POA: Diagnosis not present

## 2018-11-26 DIAGNOSIS — Z510 Encounter for antineoplastic radiation therapy: Secondary | ICD-10-CM | POA: Diagnosis not present

## 2018-11-26 DIAGNOSIS — C7931 Secondary malignant neoplasm of brain: Secondary | ICD-10-CM | POA: Diagnosis not present

## 2018-11-27 DIAGNOSIS — Z510 Encounter for antineoplastic radiation therapy: Secondary | ICD-10-CM | POA: Diagnosis not present

## 2018-11-27 DIAGNOSIS — C3412 Malignant neoplasm of upper lobe, left bronchus or lung: Secondary | ICD-10-CM | POA: Diagnosis not present

## 2018-11-28 DIAGNOSIS — Z510 Encounter for antineoplastic radiation therapy: Secondary | ICD-10-CM | POA: Diagnosis not present

## 2018-11-28 DIAGNOSIS — C3412 Malignant neoplasm of upper lobe, left bronchus or lung: Secondary | ICD-10-CM | POA: Diagnosis not present

## 2018-11-29 DIAGNOSIS — C3412 Malignant neoplasm of upper lobe, left bronchus or lung: Secondary | ICD-10-CM | POA: Diagnosis not present

## 2018-11-29 DIAGNOSIS — Z510 Encounter for antineoplastic radiation therapy: Secondary | ICD-10-CM | POA: Diagnosis not present

## 2018-12-03 DIAGNOSIS — Z510 Encounter for antineoplastic radiation therapy: Secondary | ICD-10-CM | POA: Diagnosis not present

## 2018-12-03 DIAGNOSIS — C3412 Malignant neoplasm of upper lobe, left bronchus or lung: Secondary | ICD-10-CM | POA: Diagnosis not present

## 2018-12-04 DIAGNOSIS — C7931 Secondary malignant neoplasm of brain: Secondary | ICD-10-CM | POA: Diagnosis not present

## 2018-12-04 DIAGNOSIS — C3490 Malignant neoplasm of unspecified part of unspecified bronchus or lung: Secondary | ICD-10-CM | POA: Diagnosis not present

## 2018-12-04 DIAGNOSIS — C3412 Malignant neoplasm of upper lobe, left bronchus or lung: Secondary | ICD-10-CM | POA: Diagnosis not present

## 2018-12-04 DIAGNOSIS — Z510 Encounter for antineoplastic radiation therapy: Secondary | ICD-10-CM | POA: Diagnosis not present

## 2018-12-10 DIAGNOSIS — C3490 Malignant neoplasm of unspecified part of unspecified bronchus or lung: Secondary | ICD-10-CM | POA: Diagnosis not present

## 2018-12-10 DIAGNOSIS — C7931 Secondary malignant neoplasm of brain: Secondary | ICD-10-CM | POA: Diagnosis not present

## 2018-12-10 DIAGNOSIS — Z452 Encounter for adjustment and management of vascular access device: Secondary | ICD-10-CM | POA: Diagnosis not present

## 2018-12-11 DIAGNOSIS — Z79899 Other long term (current) drug therapy: Secondary | ICD-10-CM | POA: Diagnosis not present

## 2018-12-11 DIAGNOSIS — Z5111 Encounter for antineoplastic chemotherapy: Secondary | ICD-10-CM | POA: Diagnosis not present

## 2018-12-11 DIAGNOSIS — E119 Type 2 diabetes mellitus without complications: Secondary | ICD-10-CM | POA: Diagnosis not present

## 2018-12-11 DIAGNOSIS — C7931 Secondary malignant neoplasm of brain: Secondary | ICD-10-CM | POA: Diagnosis not present

## 2018-12-11 DIAGNOSIS — I10 Essential (primary) hypertension: Secondary | ICD-10-CM | POA: Diagnosis not present

## 2018-12-11 DIAGNOSIS — C3490 Malignant neoplasm of unspecified part of unspecified bronchus or lung: Secondary | ICD-10-CM | POA: Diagnosis not present

## 2018-12-11 DIAGNOSIS — F17210 Nicotine dependence, cigarettes, uncomplicated: Secondary | ICD-10-CM | POA: Diagnosis not present

## 2018-12-11 DIAGNOSIS — Z7984 Long term (current) use of oral hypoglycemic drugs: Secondary | ICD-10-CM | POA: Diagnosis not present

## 2018-12-11 DIAGNOSIS — C3412 Malignant neoplasm of upper lobe, left bronchus or lung: Secondary | ICD-10-CM | POA: Diagnosis not present

## 2018-12-12 DIAGNOSIS — Z79899 Other long term (current) drug therapy: Secondary | ICD-10-CM | POA: Diagnosis not present

## 2018-12-12 DIAGNOSIS — C3490 Malignant neoplasm of unspecified part of unspecified bronchus or lung: Secondary | ICD-10-CM | POA: Diagnosis not present

## 2018-12-12 DIAGNOSIS — E119 Type 2 diabetes mellitus without complications: Secondary | ICD-10-CM | POA: Diagnosis not present

## 2018-12-12 DIAGNOSIS — C3412 Malignant neoplasm of upper lobe, left bronchus or lung: Secondary | ICD-10-CM | POA: Diagnosis not present

## 2018-12-12 DIAGNOSIS — Z5111 Encounter for antineoplastic chemotherapy: Secondary | ICD-10-CM | POA: Diagnosis not present

## 2018-12-12 DIAGNOSIS — C7931 Secondary malignant neoplasm of brain: Secondary | ICD-10-CM | POA: Diagnosis not present

## 2018-12-12 DIAGNOSIS — F17210 Nicotine dependence, cigarettes, uncomplicated: Secondary | ICD-10-CM | POA: Diagnosis not present

## 2018-12-12 DIAGNOSIS — I10 Essential (primary) hypertension: Secondary | ICD-10-CM | POA: Diagnosis not present

## 2018-12-12 DIAGNOSIS — Z7984 Long term (current) use of oral hypoglycemic drugs: Secondary | ICD-10-CM | POA: Diagnosis not present

## 2018-12-13 DIAGNOSIS — Z7984 Long term (current) use of oral hypoglycemic drugs: Secondary | ICD-10-CM | POA: Diagnosis not present

## 2018-12-13 DIAGNOSIS — C3412 Malignant neoplasm of upper lobe, left bronchus or lung: Secondary | ICD-10-CM | POA: Diagnosis not present

## 2018-12-13 DIAGNOSIS — Z79899 Other long term (current) drug therapy: Secondary | ICD-10-CM | POA: Diagnosis not present

## 2018-12-13 DIAGNOSIS — I10 Essential (primary) hypertension: Secondary | ICD-10-CM | POA: Diagnosis not present

## 2018-12-13 DIAGNOSIS — E119 Type 2 diabetes mellitus without complications: Secondary | ICD-10-CM | POA: Diagnosis not present

## 2018-12-13 DIAGNOSIS — C3490 Malignant neoplasm of unspecified part of unspecified bronchus or lung: Secondary | ICD-10-CM | POA: Diagnosis not present

## 2018-12-13 DIAGNOSIS — C7931 Secondary malignant neoplasm of brain: Secondary | ICD-10-CM | POA: Diagnosis not present

## 2018-12-13 DIAGNOSIS — F17210 Nicotine dependence, cigarettes, uncomplicated: Secondary | ICD-10-CM | POA: Diagnosis not present

## 2018-12-13 DIAGNOSIS — Z5111 Encounter for antineoplastic chemotherapy: Secondary | ICD-10-CM | POA: Diagnosis not present

## 2018-12-31 DIAGNOSIS — C50922 Malignant neoplasm of unspecified site of left male breast: Secondary | ICD-10-CM | POA: Diagnosis not present

## 2018-12-31 DIAGNOSIS — H7012 Chronic mastoiditis, left ear: Secondary | ICD-10-CM | POA: Diagnosis not present

## 2018-12-31 DIAGNOSIS — C7931 Secondary malignant neoplasm of brain: Secondary | ICD-10-CM | POA: Diagnosis not present

## 2019-01-01 DIAGNOSIS — Z5111 Encounter for antineoplastic chemotherapy: Secondary | ICD-10-CM | POA: Diagnosis not present

## 2019-01-01 DIAGNOSIS — L918 Other hypertrophic disorders of the skin: Secondary | ICD-10-CM | POA: Diagnosis not present

## 2019-01-01 DIAGNOSIS — Z9189 Other specified personal risk factors, not elsewhere classified: Secondary | ICD-10-CM | POA: Diagnosis not present

## 2019-01-01 DIAGNOSIS — I10 Essential (primary) hypertension: Secondary | ICD-10-CM | POA: Diagnosis not present

## 2019-01-01 DIAGNOSIS — F17210 Nicotine dependence, cigarettes, uncomplicated: Secondary | ICD-10-CM | POA: Diagnosis not present

## 2019-01-01 DIAGNOSIS — C3490 Malignant neoplasm of unspecified part of unspecified bronchus or lung: Secondary | ICD-10-CM | POA: Diagnosis not present

## 2019-01-01 DIAGNOSIS — C3412 Malignant neoplasm of upper lobe, left bronchus or lung: Secondary | ICD-10-CM | POA: Diagnosis not present

## 2019-01-01 DIAGNOSIS — C7931 Secondary malignant neoplasm of brain: Secondary | ICD-10-CM | POA: Diagnosis not present

## 2019-01-02 DIAGNOSIS — C3412 Malignant neoplasm of upper lobe, left bronchus or lung: Secondary | ICD-10-CM | POA: Diagnosis not present

## 2019-01-02 DIAGNOSIS — F17210 Nicotine dependence, cigarettes, uncomplicated: Secondary | ICD-10-CM | POA: Diagnosis not present

## 2019-01-02 DIAGNOSIS — C7931 Secondary malignant neoplasm of brain: Secondary | ICD-10-CM | POA: Diagnosis not present

## 2019-01-02 DIAGNOSIS — Z5111 Encounter for antineoplastic chemotherapy: Secondary | ICD-10-CM | POA: Diagnosis not present

## 2019-01-03 DIAGNOSIS — C7931 Secondary malignant neoplasm of brain: Secondary | ICD-10-CM | POA: Diagnosis not present

## 2019-01-03 DIAGNOSIS — F17210 Nicotine dependence, cigarettes, uncomplicated: Secondary | ICD-10-CM | POA: Diagnosis not present

## 2019-01-03 DIAGNOSIS — Z5111 Encounter for antineoplastic chemotherapy: Secondary | ICD-10-CM | POA: Diagnosis not present

## 2019-01-03 DIAGNOSIS — C3412 Malignant neoplasm of upper lobe, left bronchus or lung: Secondary | ICD-10-CM | POA: Diagnosis not present

## 2019-01-08 DIAGNOSIS — H7012 Chronic mastoiditis, left ear: Secondary | ICD-10-CM | POA: Diagnosis not present

## 2019-01-08 DIAGNOSIS — Z87891 Personal history of nicotine dependence: Secondary | ICD-10-CM | POA: Diagnosis not present

## 2019-01-08 DIAGNOSIS — H906 Mixed conductive and sensorineural hearing loss, bilateral: Secondary | ICD-10-CM | POA: Diagnosis not present

## 2019-01-08 DIAGNOSIS — C7931 Secondary malignant neoplasm of brain: Secondary | ICD-10-CM | POA: Diagnosis not present

## 2019-01-22 DIAGNOSIS — C3412 Malignant neoplasm of upper lobe, left bronchus or lung: Secondary | ICD-10-CM | POA: Diagnosis not present

## 2019-01-22 DIAGNOSIS — F17210 Nicotine dependence, cigarettes, uncomplicated: Secondary | ICD-10-CM | POA: Diagnosis not present

## 2019-01-22 DIAGNOSIS — R634 Abnormal weight loss: Secondary | ICD-10-CM | POA: Diagnosis not present

## 2019-01-22 DIAGNOSIS — Z9189 Other specified personal risk factors, not elsewhere classified: Secondary | ICD-10-CM | POA: Diagnosis not present

## 2019-01-22 DIAGNOSIS — C7931 Secondary malignant neoplasm of brain: Secondary | ICD-10-CM | POA: Diagnosis not present

## 2019-01-22 DIAGNOSIS — C3490 Malignant neoplasm of unspecified part of unspecified bronchus or lung: Secondary | ICD-10-CM | POA: Diagnosis not present

## 2019-01-22 DIAGNOSIS — Z5111 Encounter for antineoplastic chemotherapy: Secondary | ICD-10-CM | POA: Diagnosis not present

## 2019-01-23 DIAGNOSIS — F17210 Nicotine dependence, cigarettes, uncomplicated: Secondary | ICD-10-CM | POA: Diagnosis not present

## 2019-01-23 DIAGNOSIS — C3412 Malignant neoplasm of upper lobe, left bronchus or lung: Secondary | ICD-10-CM | POA: Diagnosis not present

## 2019-01-23 DIAGNOSIS — C7931 Secondary malignant neoplasm of brain: Secondary | ICD-10-CM | POA: Diagnosis not present

## 2019-01-23 DIAGNOSIS — Z5111 Encounter for antineoplastic chemotherapy: Secondary | ICD-10-CM | POA: Diagnosis not present

## 2019-01-24 DIAGNOSIS — Z5111 Encounter for antineoplastic chemotherapy: Secondary | ICD-10-CM | POA: Diagnosis not present

## 2019-01-24 DIAGNOSIS — C7931 Secondary malignant neoplasm of brain: Secondary | ICD-10-CM | POA: Diagnosis not present

## 2019-01-24 DIAGNOSIS — F17210 Nicotine dependence, cigarettes, uncomplicated: Secondary | ICD-10-CM | POA: Diagnosis not present

## 2019-01-24 DIAGNOSIS — C3412 Malignant neoplasm of upper lobe, left bronchus or lung: Secondary | ICD-10-CM | POA: Diagnosis not present

## 2019-02-11 DIAGNOSIS — E041 Nontoxic single thyroid nodule: Secondary | ICD-10-CM | POA: Diagnosis not present

## 2019-02-11 DIAGNOSIS — C3412 Malignant neoplasm of upper lobe, left bronchus or lung: Secondary | ICD-10-CM | POA: Diagnosis not present

## 2019-02-12 DIAGNOSIS — C3412 Malignant neoplasm of upper lobe, left bronchus or lung: Secondary | ICD-10-CM | POA: Diagnosis not present

## 2019-02-12 DIAGNOSIS — Z79899 Other long term (current) drug therapy: Secondary | ICD-10-CM | POA: Diagnosis not present

## 2019-02-12 DIAGNOSIS — B370 Candidal stomatitis: Secondary | ICD-10-CM | POA: Diagnosis not present

## 2019-02-12 DIAGNOSIS — E876 Hypokalemia: Secondary | ICD-10-CM | POA: Diagnosis not present

## 2019-02-12 DIAGNOSIS — Z5111 Encounter for antineoplastic chemotherapy: Secondary | ICD-10-CM | POA: Diagnosis not present

## 2019-02-13 DIAGNOSIS — Z5111 Encounter for antineoplastic chemotherapy: Secondary | ICD-10-CM | POA: Diagnosis not present

## 2019-02-13 DIAGNOSIS — Z79899 Other long term (current) drug therapy: Secondary | ICD-10-CM | POA: Diagnosis not present

## 2019-02-13 DIAGNOSIS — C3412 Malignant neoplasm of upper lobe, left bronchus or lung: Secondary | ICD-10-CM | POA: Diagnosis not present

## 2019-02-14 DIAGNOSIS — Z79899 Other long term (current) drug therapy: Secondary | ICD-10-CM | POA: Diagnosis not present

## 2019-02-14 DIAGNOSIS — C3412 Malignant neoplasm of upper lobe, left bronchus or lung: Secondary | ICD-10-CM | POA: Diagnosis not present

## 2019-02-14 DIAGNOSIS — Z5111 Encounter for antineoplastic chemotherapy: Secondary | ICD-10-CM | POA: Diagnosis not present

## 2019-02-17 DIAGNOSIS — F17210 Nicotine dependence, cigarettes, uncomplicated: Secondary | ICD-10-CM | POA: Diagnosis not present

## 2019-02-17 DIAGNOSIS — Z79899 Other long term (current) drug therapy: Secondary | ICD-10-CM | POA: Diagnosis not present

## 2019-02-17 DIAGNOSIS — C3412 Malignant neoplasm of upper lobe, left bronchus or lung: Secondary | ICD-10-CM | POA: Diagnosis not present

## 2019-02-22 ENCOUNTER — Other Ambulatory Visit: Payer: Self-pay

## 2019-02-22 ENCOUNTER — Encounter (HOSPITAL_COMMUNITY): Payer: Self-pay | Admitting: Emergency Medicine

## 2019-02-22 ENCOUNTER — Emergency Department (HOSPITAL_COMMUNITY)
Admission: EM | Admit: 2019-02-22 | Discharge: 2019-02-22 | Disposition: A | Discharge status: Home / Self Care | Payer: BC Managed Care – PPO | Attending: Emergency Medicine | Admitting: Emergency Medicine

## 2019-02-22 DIAGNOSIS — Z87891 Personal history of nicotine dependence: Secondary | ICD-10-CM | POA: Insufficient documentation

## 2019-02-22 DIAGNOSIS — R0689 Other abnormalities of breathing: Secondary | ICD-10-CM | POA: Diagnosis not present

## 2019-02-22 DIAGNOSIS — C7931 Secondary malignant neoplasm of brain: Secondary | ICD-10-CM | POA: Diagnosis not present

## 2019-02-22 DIAGNOSIS — Z79899 Other long term (current) drug therapy: Secondary | ICD-10-CM | POA: Diagnosis not present

## 2019-02-22 DIAGNOSIS — Z7982 Long term (current) use of aspirin: Secondary | ICD-10-CM | POA: Insufficient documentation

## 2019-02-22 DIAGNOSIS — D696 Thrombocytopenia, unspecified: Secondary | ICD-10-CM

## 2019-02-22 DIAGNOSIS — E871 Hypo-osmolality and hyponatremia: Secondary | ICD-10-CM | POA: Insufficient documentation

## 2019-02-22 DIAGNOSIS — T451X5A Adverse effect of antineoplastic and immunosuppressive drugs, initial encounter: Secondary | ICD-10-CM | POA: Diagnosis not present

## 2019-02-22 DIAGNOSIS — R55 Syncope and collapse: Secondary | ICD-10-CM | POA: Insufficient documentation

## 2019-02-22 DIAGNOSIS — I10 Essential (primary) hypertension: Secondary | ICD-10-CM | POA: Insufficient documentation

## 2019-02-22 DIAGNOSIS — D701 Agranulocytosis secondary to cancer chemotherapy: Secondary | ICD-10-CM | POA: Diagnosis not present

## 2019-02-22 DIAGNOSIS — R4020 Unspecified coma: Secondary | ICD-10-CM | POA: Diagnosis not present

## 2019-02-22 DIAGNOSIS — R0902 Hypoxemia: Secondary | ICD-10-CM | POA: Diagnosis not present

## 2019-02-22 LAB — URINALYSIS, ROUTINE W REFLEX MICROSCOPIC
Bacteria, UA: NONE SEEN
Bilirubin Urine: NEGATIVE
Glucose, UA: NEGATIVE mg/dL
Ketones, ur: 80 mg/dL — AB
Leukocytes,Ua: NEGATIVE
Nitrite: NEGATIVE
Protein, ur: 30 mg/dL — AB
Specific Gravity, Urine: 1.016 (ref 1.005–1.030)
pH: 5 (ref 5.0–8.0)

## 2019-02-22 LAB — BASIC METABOLIC PANEL
Anion gap: 14 (ref 5–15)
BUN: 12 mg/dL (ref 8–23)
CO2: 22 mmol/L (ref 22–32)
Calcium: 8.3 mg/dL — ABNORMAL LOW (ref 8.9–10.3)
Chloride: 92 mmol/L — ABNORMAL LOW (ref 98–111)
Creatinine, Ser: 0.76 mg/dL (ref 0.61–1.24)
GFR calc Af Amer: >60 mL/min (ref >60)
GFR calc non Af Amer: >60 mL/min (ref >60)
Glucose, Bld: 92 mg/dL (ref 70–99)
Potassium: 3.2 mmol/L — ABNORMAL LOW (ref 3.5–5.1)
Sodium: 128 mmol/L — ABNORMAL LOW (ref 135–145)

## 2019-02-22 LAB — CBC
HCT: 32.5 % — ABNORMAL LOW (ref 39.0–52.0)
Hemoglobin: 11.1 g/dL — ABNORMAL LOW (ref 13.0–17.0)
MCH: 32.7 pg (ref 26.0–34.0)
MCHC: 34.2 g/dL (ref 30.0–36.0)
MCV: 95.9 fL (ref 80.0–100.0)
Platelets: 72 10*3/uL — ABNORMAL LOW (ref 150–400)
RBC: 3.39 MIL/uL — ABNORMAL LOW (ref 4.22–5.81)
RDW: 15.4 % (ref 11.5–15.5)
WBC: 1.7 10*3/uL — ABNORMAL LOW (ref 4.0–10.5)
nRBC: 0 % (ref 0.0–0.2)

## 2019-02-22 MED ORDER — SODIUM CHLORIDE 0.9 % IV BOLUS
1000.0000 mL | Freq: Once | INTRAVENOUS | Status: AC
  Administered 2019-02-22: 1000 mL via INTRAVENOUS

## 2019-02-22 MED ORDER — HEPARIN SOD (PORK) LOCK FLUSH 100 UNIT/ML IV SOLN
500.0000 [IU] | Freq: Once | INTRAVENOUS | Status: AC
  Administered 2019-02-22: 500 [IU]
  Filled 2019-02-22: qty 5

## 2019-02-22 NOTE — ED Triage Notes (Signed)
[place], pt had syncopal episode tonight. Pt admits to drinking 1 beer. Pt currently has lung CA and is receiving chemo.

## 2019-02-22 NOTE — ED Provider Notes (Signed)
[facility] EMERGENCY DEPARTMENT Provider Note   CSN: [id number] Arrival date & time: 02/22/19  1848     History   Chief Complaint Chief Complaint  Patient presents with  . Loss of Consciousness    HPI Kevin [name] is a 63 y.o. 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  He was at a [employer], had consumed one drink, stood from a seated position and promptly passed out.  He does not recall having any sensation of lightheadedness, palpitations or other symptoms and denies pain or injury from the fall.  He woke with people standing over him. He had loss of urine during this event.  Witnesses reported he was limp, no seizure like activity.     HPI  Past Medical History:  Diagnosis Date  . Hyperlipidemia   . Hypertension   . Restless legs syndrome (RLS)   . Seasonal allergies   . Smoker    Quit 08/04/12  . Ulcer 1988    Patient Active Problem List   Diagnosis Date Noted  . Tobacco abuse 07/23/2012  . HTN (hypertension) 07/23/2012  . RLS (restless legs syndrome) 07/23/2012  . HLD (hyperlipidemia) 07/23/2012  . Hypertension   . Restless legs syndrome (RLS)     Past Surgical History:  Procedure Laterality Date  . APPENDECTOMY  1957   hernia repair at the same time  . [place]  2000, 2002, 2007   bilateral  2x's on right and 1x on the left  . TONSILECTOMY, ADENOIDECTOMY, BILATERAL MYRINGOTOMY AND TUBES  1965        Home Medications    Prior to Admission medications   Medication Sig Start Date End Date Taking? Authorizing Provider  aspirin 81 MG tablet Take 81 mg by mouth daily.   Yes [provider]  Cholecalciferol (VITAMIN D3) 5000 units TABS Take 1 tablet by mouth daily.    Yes [provider]  furosemide (LASIX) 40 MG tablet  Take 1 tablet (40 mg total) by mouth daily. 09/23/12  Yes Lanae Crumbly, PA-C  metFORMIN (GLUCOPHAGE-XR) 500 MG 24 hr tablet Take 500 mg by mouth daily. 01/16/19  Yes [provider]  montelukast (SINGULAIR) 10 MG tablet Take 10 mg by mouth at bedtime.   Yes [provider]  Multiple Vitamin (MULTIVITAMIN) tablet Take 1 tablet by mouth daily.   Yes [provider]  Omega-3 Fatty Acids (FISH OIL) 1000 MG CAPS Take 1,000 mg by mouth daily.    Yes [provider]  potassium chloride SA (KLOR-CON) 20 MEQ tablet Take 20 mEq by mouth daily. 02/04/19  Yes [provider]  pravastatin (PRAVACHOL) 40 MG tablet Take 40 mg by mouth daily.   Yes [provider]  telmisartan (MICARDIS) 80 MG tablet Take 80 mg by mouth daily.  09/18/16  Yes [provider]  zolpidem (AMBIEN) 10 MG tablet Take 10 mg by mouth at bedtime as needed for sleep. 1/2 tab as needed   Yes [provider]  hydrochlorothiazide (HYDRODIURIL) 25 MG tablet Take 1 tablet (25 mg total) by mouth daily. 07/24/12 09/23/12  Susy Frizzle, MD    Family History Family History  Problem Relation Age of Onset  . Lung cancer Father 56  . Colon polyps Brother   . Colon cancer Paternal Aunt   .  Colon cancer Cousin   . Esophageal cancer Neg Hx   . Rectal cancer Neg Hx   . Stomach cancer Neg Hx     Social History Social History   Tobacco Use  . Smoking status: Former Smoker    Packs/day: 0.50    Types: Cigarettes, Cigars    Quit date: 07/25/2016    Years since quitting: 2.5  . Smokeless tobacco: Never Used  Substance Use Topics  . Alcohol use: Yes    Alcohol/week: 22.0 standard drinks    Types: 12 Cans of beer, 10 Shots of liquor per week  . Drug use: No     Allergies   Patient has no known allergies.   Review of Systems Review of Systems  Constitutional: Negative for chills and fever.  HENT: Negative for congestion and sore throat.   Eyes: Negative.    Respiratory: Negative for chest tightness and shortness of breath.   Cardiovascular: Negative for chest pain.  Gastrointestinal: Negative for abdominal pain, nausea and vomiting.  Genitourinary: Negative.   Musculoskeletal: Negative for arthralgias, joint swelling and neck pain.  Skin: Negative.  Negative for rash and wound.  Neurological: Positive for syncope. Negative for dizziness, weakness, light-headedness, numbness and headaches.  Psychiatric/Behavioral: Negative.      Physical Exam Updated Vital Signs BP 109/87 (BP Location: Right Arm)   Pulse 82   Temp 98.1 F (36.7 C) (Oral)   Resp 14   Ht 6\' 2"  (1.88 m)   Wt 90.7 kg   SpO2 100%   BMI 25.68 kg/m   Physical Exam Vitals signs and nursing note reviewed.  Constitutional:      Appearance: He is well-developed.  HENT:     Head: Normocephalic and atraumatic.  Eyes:     Conjunctiva/sclera: Conjunctivae normal.  Neck:     Musculoskeletal: Normal range of motion.  Cardiovascular:     Rate and Rhythm: Normal rate and regular rhythm.     Heart sounds: Normal heart sounds.  Pulmonary:     Effort: Pulmonary effort is normal.     Breath sounds: Normal breath sounds. No wheezing.  Abdominal:     General: Bowel sounds are normal.     Palpations: Abdomen is soft.     Tenderness: There is no abdominal tenderness.  Musculoskeletal: Normal range of motion.  Skin:    General: Skin is warm and dry.  Neurological:     Mental Status: He is alert.      ED Treatments / Results  Labs (all labs ordered are listed, but only abnormal results are displayed) Labs Reviewed  BASIC METABOLIC PANEL - Abnormal; Notable for the following components:      Result Value   Sodium 128 (*)    Potassium 3.2 (*)    Chloride 92 (*)    Calcium 8.3 (*)    All other components within normal limits  CBC - Abnormal; Notable for the following components:   WBC 1.7 (*)    RBC 3.39 (*)    Hemoglobin 11.1 (*)    HCT 32.5 (*)    Platelets 72 (*)     All other components within normal limits  URINALYSIS, ROUTINE W REFLEX MICROSCOPIC - Abnormal; Notable for the following components:   APPearance HAZY (*)    Hgb urine dipstick SMALL (*)    Ketones, ur 80 (*)    Protein, ur 30 (*)    All other components within normal limits    EKG EKG Interpretation  Date/Time:  Saturday  February 22 2019 19:28:25 EST Ventricular Rate:  83 PR Interval:    QRS Duration: 104 QT Interval:  381 QTC Calculation: 448 R Axis:   -15 Text Interpretation: Sinus rhythm RSR' in V1 or V2, right VCD or RVH Probable inferior infarct, old Abnormal lateral Q waves Confirmed by Virgel Manifold [phone]) on 02/22/2019 8:52:37 PM   Radiology No results found.  Procedures Procedures (including critical care time)  Medications Ordered in ED Medications  sodium chloride 0.9 % bolus 1,000 mL (0 mLs Intravenous Stopped 02/22/19 2248)     Initial Impression / Assessment and Plan / ED Course  I have reviewed the triage vital signs and the nursing notes.  Pertinent labs & imaging results that were available during my care of the patient were reviewed by me and considered in my medical decision making (see chart for details).        Labs reviewed,  Orthostatic VS reviewed. Pt denies any sx while here and with orthostatic maneuvers. He was given NS 1 L given hyponatremia. Discussed neutropenia and low platelets. Last chemo 11/20 - probably result of this tx. Denies unusual bleeding/bruising.  Cautions regarding isolation given reduced immunity.  Plan f/u with pcp prn, return precautions outlined.    Ekg stable, no arrhythmias noted on monitor, doubt syncope cardiac source.  Denies HA/CP/ palpitations.    Final Clinical Impressions(s) / ED Diagnoses   Final diagnoses:  Syncope, unspecified syncope type  Hyponatremia  Chemotherapy-induced neutropenia ([place])  Thrombocytopenia [facility])    ED Discharge Orders    None       Landis Martins 02/22/19 2304     Virgel Manifold, MD 02/22/19 [phone]

## 2019-02-22 NOTE — Discharge Instructions (Addendum)
Make sure you are drinking plenty of fluids to help prevent dehydration.  Your lab tests tonight do show some abnormalities but are common findings after having chemotherapy - your white blood cell count is 1.7 which is low. It is important that you protect yourself due to low immunity - avoid others with potential infections.  Also, your platelet count is low - this helps [place] clot our blood when we bleed. Please call your doctor or return here if you develop any unusual bleeding or bruising.

## 2019-02-28 DIAGNOSIS — F17210 Nicotine dependence, cigarettes, uncomplicated: Secondary | ICD-10-CM | POA: Diagnosis not present

## 2019-02-28 DIAGNOSIS — C7931 Secondary malignant neoplasm of brain: Secondary | ICD-10-CM | POA: Diagnosis not present

## 2019-02-28 DIAGNOSIS — C3412 Malignant neoplasm of upper lobe, left bronchus or lung: Secondary | ICD-10-CM | POA: Diagnosis not present

## 2019-02-28 DIAGNOSIS — G936 Cerebral edema: Secondary | ICD-10-CM | POA: Diagnosis not present

## 2019-02-28 DIAGNOSIS — Z9889 Other specified postprocedural states: Secondary | ICD-10-CM | POA: Diagnosis not present

## 2019-02-28 DIAGNOSIS — Z510 Encounter for antineoplastic radiation therapy: Secondary | ICD-10-CM | POA: Diagnosis not present

## 2019-02-28 DIAGNOSIS — Z923 Personal history of irradiation: Secondary | ICD-10-CM | POA: Diagnosis not present

## 2019-03-04 ENCOUNTER — Other Ambulatory Visit: Payer: Self-pay

## 2019-03-04 ENCOUNTER — Emergency Department (HOSPITAL_COMMUNITY): Payer: BC Managed Care – PPO

## 2019-03-04 ENCOUNTER — Emergency Department (HOSPITAL_COMMUNITY)
Admission: EM | Admit: 2019-03-04 | Discharge: 2019-03-04 | Discharge status: Against Medical Advice | Payer: BC Managed Care – PPO | Attending: Emergency Medicine | Admitting: Emergency Medicine

## 2019-03-04 ENCOUNTER — Encounter (HOSPITAL_COMMUNITY): Payer: Self-pay

## 2019-03-04 DIAGNOSIS — I959 Hypotension, unspecified: Secondary | ICD-10-CM | POA: Diagnosis not present

## 2019-03-04 DIAGNOSIS — Z85118 Personal history of other malignant neoplasm of bronchus and lung: Secondary | ICD-10-CM | POA: Diagnosis not present

## 2019-03-04 DIAGNOSIS — Z20828 Contact with and (suspected) exposure to other viral communicable diseases: Secondary | ICD-10-CM | POA: Insufficient documentation

## 2019-03-04 DIAGNOSIS — R569 Unspecified convulsions: Secondary | ICD-10-CM

## 2019-03-04 DIAGNOSIS — Z79899 Other long term (current) drug therapy: Secondary | ICD-10-CM | POA: Diagnosis not present

## 2019-03-04 DIAGNOSIS — I10 Essential (primary) hypertension: Secondary | ICD-10-CM | POA: Insufficient documentation

## 2019-03-04 DIAGNOSIS — Z7984 Long term (current) use of oral hypoglycemic drugs: Secondary | ICD-10-CM | POA: Insufficient documentation

## 2019-03-04 DIAGNOSIS — R0902 Hypoxemia: Secondary | ICD-10-CM | POA: Diagnosis not present

## 2019-03-04 DIAGNOSIS — Z03818 Encounter for observation for suspected exposure to other biological agents ruled out: Secondary | ICD-10-CM | POA: Diagnosis not present

## 2019-03-04 DIAGNOSIS — R531 Weakness: Secondary | ICD-10-CM | POA: Insufficient documentation

## 2019-03-04 DIAGNOSIS — Z7982 Long term (current) use of aspirin: Secondary | ICD-10-CM | POA: Insufficient documentation

## 2019-03-04 DIAGNOSIS — R404 Transient alteration of awareness: Secondary | ICD-10-CM | POA: Diagnosis not present

## 2019-03-04 DIAGNOSIS — R4020 Unspecified coma: Secondary | ICD-10-CM | POA: Diagnosis not present

## 2019-03-04 HISTORY — DX: Malignant (primary) neoplasm, unspecified: C80.1

## 2019-03-04 LAB — COMPREHENSIVE METABOLIC PANEL
ALT: 29 U/L (ref 0–44)
AST: 37 U/L (ref 15–41)
Albumin: 4.1 g/dL (ref 3.5–5.0)
Alkaline Phosphatase: 80 U/L (ref 38–126)
Anion gap: >20 — ABNORMAL HIGH (ref 5–15)
BUN: <5 mg/dL — ABNORMAL LOW (ref 8–23)
CO2: 19 mmol/L — ABNORMAL LOW (ref 22–32)
Calcium: 9.2 mg/dL (ref 8.9–10.3)
Chloride: 88 mmol/L — ABNORMAL LOW (ref 98–111)
Creatinine, Ser: 1.09 mg/dL (ref 0.61–1.24)
GFR calc Af Amer: >60 mL/min (ref >60)
GFR calc non Af Amer: >60 mL/min (ref >60)
Glucose, Bld: 131 mg/dL — ABNORMAL HIGH (ref 70–99)
Potassium: 2.9 mmol/L — ABNORMAL LOW (ref 3.5–5.1)
Sodium: 130 mmol/L — ABNORMAL LOW (ref 135–145)
Total Bilirubin: 0.7 mg/dL (ref 0.3–1.2)
Total Protein: 7.1 g/dL (ref 6.5–8.1)

## 2019-03-04 LAB — CBC WITH DIFFERENTIAL/PLATELET
Abs Immature Granulocytes: 0.93 10*3/uL — ABNORMAL HIGH (ref 0.00–0.07)
Basophils Absolute: 0 10*3/uL (ref 0.0–0.1)
Basophils Relative: 1 %
Eosinophils Absolute: 0.1 10*3/uL (ref 0.0–0.5)
Eosinophils Relative: 2 %
HCT: 36.9 % — ABNORMAL LOW (ref 39.0–52.0)
Hemoglobin: 12.2 g/dL — ABNORMAL LOW (ref 13.0–17.0)
Immature Granulocytes: 16 %
Lymphocytes Relative: 20 %
Lymphs Abs: 1.2 10*3/uL (ref 0.7–4.0)
MCH: 32.1 pg (ref 26.0–34.0)
MCHC: 33.1 g/dL (ref 30.0–36.0)
MCV: 97.1 fL (ref 80.0–100.0)
Monocytes Absolute: 1.1 10*3/uL — ABNORMAL HIGH (ref 0.1–1.0)
Monocytes Relative: 20 %
Neutro Abs: 2.5 10*3/uL (ref 1.7–7.7)
Neutrophils Relative %: 41 %
Platelets: 399 10*3/uL (ref 150–400)
RBC: 3.8 MIL/uL — ABNORMAL LOW (ref 4.22–5.81)
RDW: 16 % — ABNORMAL HIGH (ref 11.5–15.5)
WBC: 5.8 10*3/uL (ref 4.0–10.5)
nRBC: 0.5 % — ABNORMAL HIGH (ref 0.0–0.2)

## 2019-03-04 LAB — URINALYSIS, ROUTINE W REFLEX MICROSCOPIC
Bilirubin Urine: NEGATIVE
Glucose, UA: NEGATIVE mg/dL
Ketones, ur: NEGATIVE mg/dL
Leukocytes,Ua: NEGATIVE
Nitrite: NEGATIVE
Protein, ur: NEGATIVE mg/dL
Specific Gravity, Urine: 1.004 — ABNORMAL LOW (ref 1.005–1.030)
pH: 5 (ref 5.0–8.0)

## 2019-03-04 LAB — POC SARS CORONAVIRUS 2 AG -  ED: SARS Coronavirus 2 Ag: NEGATIVE

## 2019-03-04 LAB — CBG MONITORING, ED: Glucose-Capillary: 132 mg/dL — ABNORMAL HIGH (ref 70–99)

## 2019-03-04 LAB — TROPONIN I (HIGH SENSITIVITY)
Troponin I (High Sensitivity): 46 ng/L — ABNORMAL HIGH (ref <18)
Troponin I (High Sensitivity): 40 ng/L — ABNORMAL HIGH (ref <18)

## 2019-03-04 MED ORDER — CIPROFLOXACIN HCL 500 MG PO TABS: 500.0000 mg | ORAL_TABLET | Freq: Two times a day (BID) | ORAL | 0 refills | Status: DC

## 2019-03-04 MED ORDER — LEVETIRACETAM 500 MG PO TABS: 500.0000 mg | ORAL_TABLET | Freq: Two times a day (BID) | ORAL | 0 refills | Status: DC

## 2019-03-04 MED ORDER — LEVETIRACETAM IN NACL 1000 MG/100ML IV SOLN
1000.0000 mg | Freq: Once | INTRAVENOUS | Status: AC
  Administered 2019-03-04: 1000 mg via INTRAVENOUS
  Filled 2019-03-04: qty 100

## 2019-03-04 NOTE — Discharge Instructions (Addendum)
Follow-up with your family doctor this week.  Return if you have any concerns.

## 2019-03-04 NOTE — ED Provider Notes (Signed)
[facility] EMERGENCY DEPARTMENT Provider Note   CSN: [id number] Arrival date & time: 03/04/19  1726     History   Chief Complaint Chief Complaint  Patient presents with   Seizures    HPI Kevin [name] is a 63 y.o. male.     Patient had a seizure today.  Patient has a history of lung cancer and brain mets  The history is provided by the patient. No language interpreter was used.  Seizures Seizure activity on arrival: no   Seizure type:  Grand mal Preceding symptoms: no sensation of an aura present   Initial focality:  None Episode characteristics: abnormal movements   Postictal symptoms: confusion   Return to baseline: no   Severity:  Moderate Timing:  Once Progression:  Resolved Context: alcohol withdrawal     Past Medical History:  Diagnosis Date   Cancer ([place])    Lung cancer with brain metastisis   Hyperlipidemia    Hypertension    Restless legs syndrome (RLS)    Seasonal allergies    Smoker    Quit 08/04/12   Ulcer 1988    Patient Active Problem List   Diagnosis Date Noted   Tobacco abuse 07/23/2012   HTN (hypertension) 07/23/2012   RLS (restless legs syndrome) 07/23/2012   HLD (hyperlipidemia) 07/23/2012   Hypertension    Restless legs syndrome (RLS)     Past Surgical History:  Procedure Laterality Date   APPENDECTOMY  1957   hernia repair at the same time   [place]  2000, 2002, 2007   bilateral  2x's on right and 1x on the left   TONSILECTOMY, ADENOIDECTOMY, BILATERAL MYRINGOTOMY [place] Medications    Prior to Admission medications   Medication Sig Start Date End Date Taking? Authorizing Provider  aspirin 81 MG tablet Take 81 mg by mouth daily.    [provider]  Cholecalciferol (VITAMIN D3) 5000 units TABS Take 1 tablet by mouth daily.     [provider]  ciprofloxacin (CIPRO) 500 MG tablet Take 1 tablet (500 mg total) by mouth 2 (two) times daily. One po bid x 7  days 03/04/19   Milton Ferguson, MD  fluconazole (DIFLUCAN) 100 MG tablet Take 100 mg by mouth daily. 14 day course starting on 02/26/2019 02/26/19   [provider]  furosemide (LASIX) 40 MG tablet Take 1 tablet (40 mg total) by mouth daily. 09/23/12   Lanae Crumbly, PA-C  levETIRAcetam (KEPPRA) 500 MG tablet Take 1 tablet (500 mg total) by mouth 2 (two) times daily. 03/04/19   Milton Ferguson, MD  metFORMIN (GLUCOPHAGE-XR) 500 MG 24 hr tablet Take 500 mg by mouth daily. 01/16/19   [provider]  montelukast (SINGULAIR) 10 MG tablet Take 10 mg by mouth at bedtime.    [provider]  Multiple Vitamin (MULTIVITAMIN) tablet Take 1 tablet by mouth daily.    [provider]  Omega-3 Fatty Acids (FISH OIL) 1000 MG CAPS Take 1,000 mg by mouth daily.     [provider]  potassium chloride SA (KLOR-CON) 20 MEQ tablet Take 20 mEq by mouth daily. 02/04/19   [provider]  pravastatin (PRAVACHOL) 40 MG tablet Take 40 mg by mouth daily.    [provider]  telmisartan (MICARDIS) 80 MG tablet Take 80 mg by mouth daily.  09/18/16   [provider]  zolpidem (AMBIEN) 10 MG tablet Take 10 mg by  mouth at bedtime as needed for sleep. 1/2 tab as needed    [provider]  hydrochlorothiazide (HYDRODIURIL) 25 MG tablet Take 1 tablet (25 mg total) by mouth daily. 07/24/12 09/23/12  Susy Frizzle, MD    Family History Family History  Problem Relation Age of Onset   Lung cancer Father 78   Colon polyps Brother    Colon cancer Paternal Aunt    Colon cancer Cousin    Esophageal cancer Neg Hx    Rectal cancer Neg Hx    Stomach cancer Neg Hx     Social History Social History   Tobacco Use   Smoking status: Former Smoker    Packs/day: 0.50    Types: Cigarettes, Cigars    Quit date: 07/25/2016    Years since quitting: 2.6   Smokeless tobacco: Never Used  Substance Use Topics   Alcohol use: Yes    Alcohol/week: 22.0  standard drinks    Types: 12 Cans of beer, 10 Shots of liquor per week   Drug use: No     Allergies   Patient has no known allergies.   Review of Systems Review of Systems  Constitutional: Negative for appetite change and fatigue.  HENT: Negative for congestion, ear discharge and sinus pressure.   Eyes: Negative for discharge.  Respiratory: Negative for cough.   Cardiovascular: Negative for chest pain.  Gastrointestinal: Negative for abdominal pain and diarrhea.  Genitourinary: Negative for frequency and hematuria.  Musculoskeletal: Negative for back pain.  Skin: Negative for rash.  Neurological: Positive for seizures. Negative for headaches.  Psychiatric/Behavioral: Negative for hallucinations.     Physical Exam Updated Vital Signs BP 100/67    Pulse 86    Temp 97.9 F (36.6 C) (Oral)    Resp 15    Wt 90.7 kg    SpO2 93%    BMI 25.68 kg/m   Physical Exam Vitals and nursing note reviewed.  Constitutional:      Appearance: He is well-developed.  HENT:     Head: Normocephalic.     Nose: Nose normal.  Eyes:     General: No scleral icterus.    Conjunctiva/sclera: Conjunctivae normal.  Neck:     Thyroid: No thyromegaly.  Cardiovascular:     Rate and Rhythm: Normal rate and regular rhythm.     Heart sounds: No murmur. No friction rub. No gallop.   Pulmonary:     Breath sounds: No stridor. No wheezing or rales.  Chest:     Chest wall: No tenderness.  Abdominal:     General: There is no distension.     Tenderness: There is no abdominal tenderness. There is no rebound.  Musculoskeletal:        General: Normal range of motion.     Cervical back: Neck supple.  Lymphadenopathy:     Cervical: No cervical adenopathy.  Skin:    Findings: No erythema or rash.  Neurological:     Mental Status: He is alert and oriented to person, place, and time.     Motor: No abnormal muscle tone.     Coordination: Coordination normal.  Psychiatric:        Behavior: Behavior normal.        ED Treatments / Results  Labs (all labs ordered are listed, but only abnormal results are displayed) Labs Reviewed  CBC WITH DIFFERENTIAL/PLATELET - Abnormal; Notable for the following components:      Result Value   RBC 3.80 (*)  Hemoglobin 12.2 (*)    HCT 36.9 (*)    RDW 16.0 (*)    nRBC 0.5 (*)    Monocytes Absolute 1.1 (*)    Abs Immature Granulocytes 0.93 (*)    All other components within normal limits  COMPREHENSIVE METABOLIC PANEL - Abnormal; Notable for the following components:   Sodium 130 (*)    Potassium 2.9 (*)    Chloride 88 (*)    CO2 19 (*)    Glucose, Bld 131 (*)    BUN <5 (*)    Anion gap >20 (*)    All other components within normal limits  URINALYSIS, ROUTINE W REFLEX MICROSCOPIC - Abnormal; Notable for the following components:   Specific Gravity, Urine 1.004 (*)    Hgb urine dipstick SMALL (*)    Bacteria, UA RARE (*)    All other components within normal limits  CBG MONITORING, ED - Abnormal; Notable for the following components:   Glucose-Capillary 132 (*)    All other components within normal limits  TROPONIN I (HIGH SENSITIVITY) - Abnormal; Notable for the following components:   Troponin I (High Sensitivity) 46 (*)    All other components within normal limits  TROPONIN I (HIGH SENSITIVITY) - Abnormal; Notable for the following components:   Troponin I (High Sensitivity) 40 (*)    All other components within normal limits  URINE CULTURE  POC SARS CORONAVIRUS 2 AG -  ED  POC SARS CORONAVIRUS 2 AG -  ED    EKG None  Radiology Ct Head Wo Contrast  Result Date: 03/04/2019 CLINICAL DATA:  Altered level of consciousness EXAM: CT HEAD WITHOUT CONTRAST TECHNIQUE: Contiguous axial images were obtained from the base of the skull through the vertex without intravenous contrast. COMPARISON:  MRI 10/17/2018 FINDINGS: Brain: There are postsurgical changes from prior left occipital craniotomy with likely tumoral debulking/resection with a CSF  attenuation resection cavity in the left occipital lobe (2/25). Suggestion of some residual tumor burden in the left temporal lobe with ill-defined margins poorly visualized on this noncontrast CT of the head. Extensive surrounding vasogenic edema is again seen throughout the left temporal parietal and occipital lobes. No hyperdense hemorrhage is seen. No CT evidence of large territory or cortically based infarct. 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 Vascular: No hyperdense vessel or unexpected calcification. Skull: Post craniotomy changes, as above. No scalp swelling or hematoma. No suspicious osseous lesions. Sinuses/Orbits: Mild mural thickening in the maxillary and ethmoid sinuses. No air-fluid levels. Included orbital structures are unremarkable. Other: None IMPRESSION: 1. Postoperative changes from prior left occipital craniotomy with likely tumoral debulking/resection with a CSF attenuation resection cavity in the left occipital lobe. Suggestion of some residual tumor burden in the left temporal lobe with ill-defined margins poorly visualized on this noncontrast CT of the head. Consider further evaluation with contrast-enhanced MR for better evaluation. 2. Extensive surrounding vasogenic edema throughout the left temporal parietal and occipital lobes. 3. No hyperdense hemorrhage is seen. No CT evidence of large territory or cortically based infarct. 4. 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 Electronically Signed   By: Lovena Le M.D.   On: 03/04/2019 20:51   Dg Chest Portable 1 View  Result Date:  03/04/2019 CLINICAL DATA:  63 year old male with history of weakness.  Collapse with seizure. EXAM: PORTABLE CHEST 1 VIEW COMPARISON:  Chest x-ray 01/01/2017. FINDINGS: Right internal jugular single-lumen porta cath with tip terminating in the right atrium. Lung volumes are low. No consolidative airspace disease. No pleural effusions. No pneumothorax. No pulmonary nodule or mass noted. Pulmonary vasculature and the cardiomediastinal silhouette are within normal limits. IMPRESSION: 1. Low lung volumes without radiographic evidence of acute cardiopulmonary disease. Electronically Signed   By: Vinnie Langton M.D.   On: 03/04/2019 19:29    Procedures Procedures (including critical care time)  Medications Ordered in ED Medications  levETIRAcetam (KEPPRA) IVPB 1000 mg/100 mL premix (0 mg Intravenous Stopped 03/04/19 2207)     Initial Impression / Assessment and Plan / ED Course  I have reviewed the triage vital signs and the nursing notes.  Pertinent labs & imaging results that were available during my care of the patient were reviewed by me and considered in my medical decision making (see chart for details).      Patient with lung cancer and new onset seizures.  Patient has a history of brain mets.  Patient refuses to stay in the hospital even though I told him that I felt that he needed to be admitted to the hospital for the seizures.  He will be discharged home as an [place] patient and given Keppra along with Cipro for possible urinary tract infection    Final Clinical Impressions(s) / ED Diagnoses   Final diagnoses:  Seizure [facility])    ED Discharge Orders         Ordered    levETIRAcetam (KEPPRA) 500 MG tablet  2 times daily     03/04/19 2220    ciprofloxacin (CIPRO) 500 MG tablet  2 times daily     03/04/19 2220           Milton Ferguson, MD 03/07/19 [phone]

## 2019-03-04 NOTE — ED Triage Notes (Addendum)
Pt collapsed in yard. Fought with Psychologist, occupational. Pt was placed on stretcher by EMS and  had witnessed seizure lasting approximately 2 minutes. Unable to understand what pt is saying at times. Pt receives Chemo at [facility]

## 2019-03-06 DIAGNOSIS — C7931 Secondary malignant neoplasm of brain: Secondary | ICD-10-CM | POA: Diagnosis not present

## 2019-03-06 DIAGNOSIS — Z79899 Other long term (current) drug therapy: Secondary | ICD-10-CM | POA: Diagnosis not present

## 2019-03-06 DIAGNOSIS — C3490 Malignant neoplasm of unspecified part of unspecified bronchus or lung: Secondary | ICD-10-CM | POA: Diagnosis not present

## 2019-03-06 DIAGNOSIS — E119 Type 2 diabetes mellitus without complications: Secondary | ICD-10-CM | POA: Diagnosis not present

## 2019-03-06 DIAGNOSIS — R410 Disorientation, unspecified: Secondary | ICD-10-CM | POA: Diagnosis not present

## 2019-03-06 DIAGNOSIS — F17210 Nicotine dependence, cigarettes, uncomplicated: Secondary | ICD-10-CM | POA: Diagnosis not present

## 2019-03-06 DIAGNOSIS — C719 Malignant neoplasm of brain, unspecified: Secondary | ICD-10-CM | POA: Diagnosis not present

## 2019-03-06 DIAGNOSIS — R4182 Altered mental status, unspecified: Secondary | ICD-10-CM | POA: Diagnosis not present

## 2019-03-06 DIAGNOSIS — I10 Essential (primary) hypertension: Secondary | ICD-10-CM | POA: Diagnosis not present

## 2019-03-06 LAB — URINE CULTURE: Culture: NO GROWTH

## 2019-03-12 DIAGNOSIS — Z923 Personal history of irradiation: Secondary | ICD-10-CM | POA: Diagnosis not present

## 2019-03-12 DIAGNOSIS — C7931 Secondary malignant neoplasm of brain: Secondary | ICD-10-CM | POA: Diagnosis not present

## 2019-03-12 DIAGNOSIS — Z510 Encounter for antineoplastic radiation therapy: Secondary | ICD-10-CM | POA: Diagnosis not present

## 2019-03-12 DIAGNOSIS — C3412 Malignant neoplasm of upper lobe, left bronchus or lung: Secondary | ICD-10-CM | POA: Diagnosis not present

## 2019-03-12 DIAGNOSIS — F17210 Nicotine dependence, cigarettes, uncomplicated: Secondary | ICD-10-CM | POA: Diagnosis not present

## 2019-03-16 DIAGNOSIS — F17210 Nicotine dependence, cigarettes, uncomplicated: Secondary | ICD-10-CM | POA: Diagnosis not present

## 2019-03-16 DIAGNOSIS — C7931 Secondary malignant neoplasm of brain: Secondary | ICD-10-CM | POA: Diagnosis not present

## 2019-03-16 DIAGNOSIS — Z923 Personal history of irradiation: Secondary | ICD-10-CM | POA: Diagnosis not present

## 2019-03-16 DIAGNOSIS — Z510 Encounter for antineoplastic radiation therapy: Secondary | ICD-10-CM | POA: Diagnosis not present

## 2019-03-16 DIAGNOSIS — C3412 Malignant neoplasm of upper lobe, left bronchus or lung: Secondary | ICD-10-CM | POA: Diagnosis not present

## 2019-03-17 DIAGNOSIS — C7931 Secondary malignant neoplasm of brain: Secondary | ICD-10-CM | POA: Diagnosis not present

## 2019-03-17 DIAGNOSIS — Z510 Encounter for antineoplastic radiation therapy: Secondary | ICD-10-CM | POA: Diagnosis not present

## 2019-03-17 DIAGNOSIS — C3412 Malignant neoplasm of upper lobe, left bronchus or lung: Secondary | ICD-10-CM | POA: Diagnosis not present

## 2019-03-18 DIAGNOSIS — Z510 Encounter for antineoplastic radiation therapy: Secondary | ICD-10-CM | POA: Diagnosis not present

## 2019-03-18 DIAGNOSIS — C3412 Malignant neoplasm of upper lobe, left bronchus or lung: Secondary | ICD-10-CM | POA: Diagnosis not present

## 2019-03-18 DIAGNOSIS — C7931 Secondary malignant neoplasm of brain: Secondary | ICD-10-CM | POA: Diagnosis not present

## 2019-03-19 DIAGNOSIS — Z510 Encounter for antineoplastic radiation therapy: Secondary | ICD-10-CM | POA: Diagnosis not present

## 2019-03-19 DIAGNOSIS — C7931 Secondary malignant neoplasm of brain: Secondary | ICD-10-CM | POA: Diagnosis not present

## 2019-03-19 DIAGNOSIS — C3412 Malignant neoplasm of upper lobe, left bronchus or lung: Secondary | ICD-10-CM | POA: Diagnosis not present

## 2019-03-24 DIAGNOSIS — C3412 Malignant neoplasm of upper lobe, left bronchus or lung: Secondary | ICD-10-CM | POA: Diagnosis not present

## 2019-03-24 DIAGNOSIS — C7931 Secondary malignant neoplasm of brain: Secondary | ICD-10-CM | POA: Diagnosis not present

## 2019-03-24 DIAGNOSIS — Z510 Encounter for antineoplastic radiation therapy: Secondary | ICD-10-CM | POA: Diagnosis not present

## 2019-03-25 DIAGNOSIS — F17210 Nicotine dependence, cigarettes, uncomplicated: Secondary | ICD-10-CM | POA: Diagnosis not present

## 2019-03-25 DIAGNOSIS — E1169 Type 2 diabetes mellitus with other specified complication: Secondary | ICD-10-CM | POA: Diagnosis not present

## 2019-03-25 DIAGNOSIS — Z923 Personal history of irradiation: Secondary | ICD-10-CM | POA: Diagnosis not present

## 2019-03-25 DIAGNOSIS — C7931 Secondary malignant neoplasm of brain: Secondary | ICD-10-CM | POA: Diagnosis not present

## 2019-03-25 DIAGNOSIS — E119 Type 2 diabetes mellitus without complications: Secondary | ICD-10-CM | POA: Diagnosis not present

## 2019-03-25 DIAGNOSIS — Z510 Encounter for antineoplastic radiation therapy: Secondary | ICD-10-CM | POA: Diagnosis not present

## 2019-03-25 DIAGNOSIS — E1165 Type 2 diabetes mellitus with hyperglycemia: Secondary | ICD-10-CM | POA: Diagnosis not present

## 2019-03-25 DIAGNOSIS — E782 Mixed hyperlipidemia: Secondary | ICD-10-CM | POA: Diagnosis not present

## 2019-03-25 DIAGNOSIS — I10 Essential (primary) hypertension: Secondary | ICD-10-CM | POA: Diagnosis not present

## 2019-03-25 DIAGNOSIS — C3412 Malignant neoplasm of upper lobe, left bronchus or lung: Secondary | ICD-10-CM | POA: Diagnosis not present

## 2019-03-26 DIAGNOSIS — C3412 Malignant neoplasm of upper lobe, left bronchus or lung: Secondary | ICD-10-CM | POA: Diagnosis not present

## 2019-03-26 DIAGNOSIS — C7931 Secondary malignant neoplasm of brain: Secondary | ICD-10-CM | POA: Diagnosis not present

## 2019-03-26 DIAGNOSIS — Z510 Encounter for antineoplastic radiation therapy: Secondary | ICD-10-CM | POA: Diagnosis not present

## 2019-03-27 DIAGNOSIS — C7931 Secondary malignant neoplasm of brain: Secondary | ICD-10-CM | POA: Diagnosis not present

## 2019-03-27 DIAGNOSIS — Z510 Encounter for antineoplastic radiation therapy: Secondary | ICD-10-CM | POA: Diagnosis not present

## 2019-03-27 DIAGNOSIS — C3412 Malignant neoplasm of upper lobe, left bronchus or lung: Secondary | ICD-10-CM | POA: Diagnosis not present

## 2019-03-29 DIAGNOSIS — R404 Transient alteration of awareness: Secondary | ICD-10-CM | POA: Diagnosis not present

## 2019-03-31 ENCOUNTER — Inpatient Hospital Stay (HOSPITAL_COMMUNITY)
Admission: EM | Admit: 2019-03-31 | Discharge: 2019-04-03 | DRG: 100 | Disposition: A | Discharge status: Hospice | Payer: BC Managed Care – PPO | Attending: Internal Medicine | Admitting: Internal Medicine

## 2019-03-31 ENCOUNTER — Inpatient Hospital Stay (HOSPITAL_COMMUNITY): Payer: BC Managed Care – PPO

## 2019-03-31 ENCOUNTER — Emergency Department (HOSPITAL_COMMUNITY): Payer: BC Managed Care – PPO

## 2019-03-31 ENCOUNTER — Encounter (HOSPITAL_COMMUNITY): Payer: Self-pay | Admitting: Emergency Medicine

## 2019-03-31 DIAGNOSIS — Z87891 Personal history of nicotine dependence: Secondary | ICD-10-CM | POA: Diagnosis not present

## 2019-03-31 DIAGNOSIS — E871 Hypo-osmolality and hyponatremia: Secondary | ICD-10-CM | POA: Diagnosis not present

## 2019-03-31 DIAGNOSIS — R569 Unspecified convulsions: Secondary | ICD-10-CM

## 2019-03-31 DIAGNOSIS — G936 Cerebral edema: Secondary | ICD-10-CM | POA: Diagnosis present

## 2019-03-31 DIAGNOSIS — T451X5A Adverse effect of antineoplastic and immunosuppressive drugs, initial encounter: Secondary | ICD-10-CM | POA: Diagnosis present

## 2019-03-31 DIAGNOSIS — Y92009 Unspecified place in unspecified non-institutional (private) residence as the place of occurrence of the external cause: Secondary | ICD-10-CM

## 2019-03-31 DIAGNOSIS — Z7189 Other specified counseling: Secondary | ICD-10-CM | POA: Diagnosis not present

## 2019-03-31 DIAGNOSIS — Z8371 Family history of colonic polyps: Secondary | ICD-10-CM

## 2019-03-31 DIAGNOSIS — R4182 Altered mental status, unspecified: Secondary | ICD-10-CM

## 2019-03-31 DIAGNOSIS — R0689 Other abnormalities of breathing: Secondary | ICD-10-CM | POA: Diagnosis not present

## 2019-03-31 DIAGNOSIS — J9601 Acute respiratory failure with hypoxia: Secondary | ICD-10-CM | POA: Diagnosis not present

## 2019-03-31 DIAGNOSIS — G40909 Epilepsy, unspecified, not intractable, without status epilepticus: Secondary | ICD-10-CM

## 2019-03-31 DIAGNOSIS — Z515 Encounter for palliative care: Secondary | ICD-10-CM | POA: Diagnosis not present

## 2019-03-31 DIAGNOSIS — E785 Hyperlipidemia, unspecified: Secondary | ICD-10-CM | POA: Diagnosis present

## 2019-03-31 DIAGNOSIS — R000 Tachycardia, unspecified: Secondary | ICD-10-CM | POA: Diagnosis not present

## 2019-03-31 DIAGNOSIS — R54 Age-related physical debility: Secondary | ICD-10-CM | POA: Diagnosis present

## 2019-03-31 DIAGNOSIS — Z7982 Long term (current) use of aspirin: Secondary | ICD-10-CM

## 2019-03-31 DIAGNOSIS — G2581 Restless legs syndrome: Secondary | ICD-10-CM | POA: Diagnosis present

## 2019-03-31 DIAGNOSIS — I10 Essential (primary) hypertension: Secondary | ICD-10-CM | POA: Diagnosis not present

## 2019-03-31 DIAGNOSIS — E119 Type 2 diabetes mellitus without complications: Secondary | ICD-10-CM | POA: Diagnosis present

## 2019-03-31 DIAGNOSIS — G9340 Encephalopathy, unspecified: Secondary | ICD-10-CM | POA: Diagnosis present

## 2019-03-31 DIAGNOSIS — Z20822 Contact with and (suspected) exposure to covid-19: Secondary | ICD-10-CM | POA: Diagnosis not present

## 2019-03-31 DIAGNOSIS — Z66 Do not resuscitate: Secondary | ICD-10-CM | POA: Diagnosis not present

## 2019-03-31 DIAGNOSIS — D84821 Immunodeficiency due to drugs: Secondary | ICD-10-CM | POA: Diagnosis not present

## 2019-03-31 DIAGNOSIS — Z7984 Long term (current) use of oral hypoglycemic drugs: Secondary | ICD-10-CM

## 2019-03-31 DIAGNOSIS — J690 Pneumonitis due to inhalation of food and vomit: Secondary | ICD-10-CM | POA: Diagnosis not present

## 2019-03-31 DIAGNOSIS — Z800 Family history of malignant neoplasm of digestive organs: Secondary | ICD-10-CM | POA: Diagnosis not present

## 2019-03-31 DIAGNOSIS — R4020 Unspecified coma: Secondary | ICD-10-CM | POA: Diagnosis not present

## 2019-03-31 DIAGNOSIS — C3490 Malignant neoplasm of unspecified part of unspecified bronchus or lung: Secondary | ICD-10-CM | POA: Diagnosis not present

## 2019-03-31 DIAGNOSIS — R0902 Hypoxemia: Secondary | ICD-10-CM

## 2019-03-31 DIAGNOSIS — Z79899 Other long term (current) drug therapy: Secondary | ICD-10-CM

## 2019-03-31 DIAGNOSIS — Z801 Family history of malignant neoplasm of trachea, bronchus and lung: Secondary | ICD-10-CM

## 2019-03-31 DIAGNOSIS — R404 Transient alteration of awareness: Secondary | ICD-10-CM | POA: Diagnosis not present

## 2019-03-31 DIAGNOSIS — J9811 Atelectasis: Secondary | ICD-10-CM | POA: Diagnosis not present

## 2019-03-31 DIAGNOSIS — C7931 Secondary malignant neoplasm of brain: Secondary | ICD-10-CM | POA: Diagnosis not present

## 2019-03-31 DIAGNOSIS — G9341 Metabolic encephalopathy: Secondary | ICD-10-CM | POA: Diagnosis present

## 2019-03-31 DIAGNOSIS — E876 Hypokalemia: Secondary | ICD-10-CM | POA: Diagnosis present

## 2019-03-31 DIAGNOSIS — R0989 Other specified symptoms and signs involving the circulatory and respiratory systems: Secondary | ICD-10-CM | POA: Diagnosis not present

## 2019-03-31 LAB — URINALYSIS, COMPLETE (UACMP) WITH MICROSCOPIC
Bilirubin Urine: NEGATIVE
Glucose, UA: NEGATIVE mg/dL
Ketones, ur: 80 mg/dL — AB
Leukocytes,Ua: NEGATIVE
Nitrite: NEGATIVE
Protein, ur: 30 mg/dL — AB
RBC / HPF: >50 RBC/hpf — ABNORMAL HIGH (ref 0–5)
Specific Gravity, Urine: 1.014 (ref 1.005–1.030)
pH: 6 (ref 5.0–8.0)

## 2019-03-31 LAB — COMPREHENSIVE METABOLIC PANEL
ALT: 13 U/L (ref 0–44)
AST: 20 U/L (ref 15–41)
Albumin: 3.4 g/dL — ABNORMAL LOW (ref 3.5–5.0)
Alkaline Phosphatase: 62 U/L (ref 38–126)
Anion gap: 20 — ABNORMAL HIGH (ref 5–15)
BUN: 6 mg/dL — ABNORMAL LOW (ref 8–23)
CO2: 23 mmol/L (ref 22–32)
Calcium: 9 mg/dL (ref 8.9–10.3)
Chloride: 91 mmol/L — ABNORMAL LOW (ref 98–111)
Creatinine, Ser: 0.66 mg/dL (ref 0.61–1.24)
GFR calc Af Amer: >60 mL/min (ref >60)
GFR calc non Af Amer: >60 mL/min (ref >60)
Glucose, Bld: 99 mg/dL (ref 70–99)
Potassium: 2.7 mmol/L — CL (ref 3.5–5.1)
Sodium: 134 mmol/L — ABNORMAL LOW (ref 135–145)
Total Bilirubin: 1.5 mg/dL — ABNORMAL HIGH (ref 0.3–1.2)
Total Protein: 6.1 g/dL — ABNORMAL LOW (ref 6.5–8.1)

## 2019-03-31 LAB — CBC WITH DIFFERENTIAL/PLATELET
Abs Immature Granulocytes: 0.02 10*3/uL (ref 0.00–0.07)
Basophils Absolute: 0 10*3/uL (ref 0.0–0.1)
Basophils Relative: 0 %
Eosinophils Absolute: 0 10*3/uL (ref 0.0–0.5)
Eosinophils Relative: 0 %
HCT: 38.8 % — ABNORMAL LOW (ref 39.0–52.0)
Hemoglobin: 13 g/dL (ref 13.0–17.0)
Immature Granulocytes: 0 %
Lymphocytes Relative: 6 %
Lymphs Abs: 0.5 10*3/uL — ABNORMAL LOW (ref 0.7–4.0)
MCH: 32.3 pg (ref 26.0–34.0)
MCHC: 33.5 g/dL (ref 30.0–36.0)
MCV: 96.3 fL (ref 80.0–100.0)
Monocytes Absolute: 1.2 10*3/uL — ABNORMAL HIGH (ref 0.1–1.0)
Monocytes Relative: 15 %
Neutro Abs: 6.1 10*3/uL (ref 1.7–7.7)
Neutrophils Relative %: 79 %
Platelets: 229 10*3/uL (ref 150–400)
RBC: 4.03 MIL/uL — ABNORMAL LOW (ref 4.22–5.81)
RDW: 15.6 % — ABNORMAL HIGH (ref 11.5–15.5)
WBC: 7.8 10*3/uL (ref 4.0–10.5)
nRBC: 0 % (ref 0.0–0.2)

## 2019-03-31 LAB — TSH: TSH: 0.986 u[IU]/mL (ref 0.350–4.500)

## 2019-03-31 LAB — CBG MONITORING, ED: Glucose-Capillary: 90 mg/dL (ref 70–99)

## 2019-03-31 LAB — MRSA PCR SCREENING: MRSA by PCR: NEGATIVE

## 2019-03-31 LAB — SARS CORONAVIRUS 2 (TAT 6-24 HRS): SARS Coronavirus 2: NEGATIVE

## 2019-03-31 LAB — RAPID URINE DRUG SCREEN, HOSP PERFORMED
Amphetamines: NOT DETECTED
Barbiturates: NOT DETECTED
Benzodiazepines: NOT DETECTED
Cocaine: NOT DETECTED
Opiates: NOT DETECTED
Tetrahydrocannabinol: NOT DETECTED

## 2019-03-31 LAB — VITAMIN B12: Vitamin B-12: 728 pg/mL (ref 180–914)

## 2019-03-31 LAB — AMMONIA: Ammonia: 24 umol/L (ref 9–35)

## 2019-03-31 LAB — HIV ANTIBODY (ROUTINE TESTING W REFLEX): HIV Screen 4th Generation wRfx: NONREACTIVE

## 2019-03-31 LAB — GLUCOSE, CAPILLARY: Glucose-Capillary: 93 mg/dL (ref 70–99)

## 2019-03-31 LAB — MAGNESIUM: Magnesium: 1.6 mg/dL — ABNORMAL LOW (ref 1.7–2.4)

## 2019-03-31 LAB — CK: Total CK: 363 U/L (ref 49–397)

## 2019-03-31 LAB — POC SARS CORONAVIRUS 2 AG -  ED: SARS Coronavirus 2 Ag: NEGATIVE

## 2019-03-31 LAB — ETHANOL: Alcohol, Ethyl (B): <10 mg/dL (ref <10)

## 2019-03-31 MED ORDER — LEVETIRACETAM IN NACL 1000 MG/100ML IV SOLN
1000.0000 mg | Freq: Two times a day (BID) | INTRAVENOUS | Status: DC
  Administered 2019-04-01 – 2019-04-03 (×5): 1000 mg via INTRAVENOUS
  Filled 2019-03-31 (×5): qty 100

## 2019-03-31 MED ORDER — INSULIN ASPART 100 UNIT/ML ~~LOC~~ SOLN: 0.0000 [IU] | Freq: Three times a day (TID) | SUBCUTANEOUS | Status: DC

## 2019-03-31 MED ORDER — ASPIRIN 81 MG PO CHEW
81.0000 mg | CHEWABLE_TABLET | Freq: Every day | ORAL | Status: DC
  Filled 2019-03-31: qty 1

## 2019-03-31 MED ORDER — LEVETIRACETAM IN NACL 500 MG/100ML IV SOLN
500.0000 mg | Freq: Two times a day (BID) | INTRAVENOUS | Status: DC
  Filled 2019-03-31 (×2): qty 100

## 2019-03-31 MED ORDER — LORAZEPAM 2 MG/ML IJ SOLN
INTRAMUSCULAR | Status: AC
  Administered 2019-03-31: 2 mg via INTRAVENOUS
  Filled 2019-03-31: qty 1

## 2019-03-31 MED ORDER — SODIUM CHLORIDE 0.9 % IV SOLN
INTRAVENOUS | Status: DC | PRN
  Administered 2019-03-31: 11:00:00 500 mL via INTRAVENOUS
  Administered 2019-04-02: 1000 mL via INTRAVENOUS

## 2019-03-31 MED ORDER — MONTELUKAST SODIUM 10 MG PO TABS: 10.0000 mg | ORAL_TABLET | Freq: Every day | ORAL | Status: DC

## 2019-03-31 MED ORDER — INSULIN ASPART 100 UNIT/ML ~~LOC~~ SOLN: 0.0000 [IU] | Freq: Every day | SUBCUTANEOUS | Status: DC

## 2019-03-31 MED ORDER — LEVETIRACETAM IN NACL 1000 MG/100ML IV SOLN
INTRAVENOUS | Status: AC
  Administered 2019-03-31: 1000 mg
  Filled 2019-03-31: qty 100

## 2019-03-31 MED ORDER — POTASSIUM CHLORIDE 10 MEQ/100ML IV SOLN
10.0000 meq | INTRAVENOUS | Status: AC
  Administered 2019-03-31 (×6): 10 meq via INTRAVENOUS
  Filled 2019-03-31 (×6): qty 100

## 2019-03-31 MED ORDER — LORAZEPAM 2 MG/ML IJ SOLN
2.0000 mg | INTRAMUSCULAR | Status: DC | PRN
  Administered 2019-03-31 – 2019-04-03 (×2): 2 mg via INTRAVENOUS
  Filled 2019-03-31 (×2): qty 1

## 2019-03-31 MED ORDER — ORAL CARE MOUTH RINSE
15.0000 mL | Freq: Two times a day (BID) | OROMUCOSAL | Status: DC
  Administered 2019-03-31 – 2019-04-03 (×5): 15 mL via OROMUCOSAL

## 2019-03-31 MED ORDER — POTASSIUM CHLORIDE 10 MEQ/100ML IV SOLN
10.0000 meq | Freq: Once | INTRAVENOUS | Status: AC
  Administered 2019-03-31: 10 meq via INTRAVENOUS
  Filled 2019-03-31: qty 100

## 2019-03-31 MED ORDER — ONDANSETRON HCL 4 MG/2ML IJ SOLN: 4.0000 mg | Freq: Four times a day (QID) | INTRAMUSCULAR | Status: DC | PRN

## 2019-03-31 MED ORDER — LORAZEPAM 2 MG/ML IJ SOLN
INTRAMUSCULAR | Status: AC
  Filled 2019-03-31: qty 1

## 2019-03-31 MED ORDER — SODIUM CHLORIDE 0.9 % IV SOLN: INTRAVENOUS | Status: AC

## 2019-03-31 MED ORDER — SODIUM CHLORIDE 0.9% FLUSH: 10.0000 mL | INTRAVENOUS | Status: DC | PRN

## 2019-03-31 MED ORDER — ACETAMINOPHEN 650 MG RE SUPP: 650.0000 mg | Freq: Four times a day (QID) | RECTAL | Status: DC | PRN

## 2019-03-31 MED ORDER — ONDANSETRON HCL 4 MG PO TABS: 4.0000 mg | ORAL_TABLET | Freq: Four times a day (QID) | ORAL | Status: DC | PRN

## 2019-03-31 MED ORDER — SODIUM CHLORIDE 0.9% FLUSH
10.0000 mL | Freq: Two times a day (BID) | INTRAVENOUS | Status: DC
  Administered 2019-03-31 – 2019-04-03 (×5): 10 mL

## 2019-03-31 MED ORDER — MAGNESIUM SULFATE 2 GM/50ML IV SOLN
2.0000 g | Freq: Once | INTRAVENOUS | Status: AC
  Administered 2019-03-31: 2 g via INTRAVENOUS
  Filled 2019-03-31: qty 50

## 2019-03-31 MED ORDER — LABETALOL HCL 5 MG/ML IV SOLN: 10.0000 mg | INTRAVENOUS | Status: DC | PRN

## 2019-03-31 MED ORDER — ENOXAPARIN SODIUM 40 MG/0.4ML ~~LOC~~ SOLN
40.0000 mg | SUBCUTANEOUS | Status: DC
  Administered 2019-03-31 – 2019-04-02 (×3): 40 mg via SUBCUTANEOUS
  Filled 2019-03-31 (×3): qty 0.4

## 2019-03-31 MED ORDER — ACETAMINOPHEN 325 MG PO TABS: 650.0000 mg | ORAL_TABLET | Freq: Four times a day (QID) | ORAL | Status: DC | PRN

## 2019-03-31 MED ORDER — CHLORHEXIDINE GLUCONATE CLOTH 2 % EX PADS
6.0000 | MEDICATED_PAD | Freq: Every day | CUTANEOUS | Status: DC
  Administered 2019-03-31 – 2019-04-03 (×4): 6 via TOPICAL

## 2019-03-31 MED ORDER — LORAZEPAM 2 MG/ML IJ SOLN: 2.0000 mg | Freq: Once | INTRAMUSCULAR | Status: AC

## 2019-03-31 NOTE — ED Notes (Signed)
ED TO INPATIENT HANDOFF REPORT  ED Nurse Name and Phone #: [phone]  S Name/Age/Gender Kevin [name] 64 y.o. male Room/Bed: APA11/APA11  Code Status   Code Status: Full Code  Home/SNF/Other Home  Is this baseline? No   Triage Complete: Triage complete  Chief Complaint Acute encephalopathy [G93.40]  Triage Note Pt's mother called ems for altered mental status. She was going to pick him up for his radiation tx today due to brain ca and found him "unresponsive". Pt will respond to his name but nothing else.     Allergies No Known Allergies  Level of Care/Admitting Diagnosis ED Disposition    ED Disposition Condition [facility]: [facility] [[id number]]  Level of Care: Stepdown [14]  Covid Evaluation: Asymptomatic Screening Protocol (No Symptoms)  Diagnosis: Acute encephalopathy [244010]  Admitting Physician: Rodena Goldmann [[id number]]  Attending Physician: Rodena Goldmann [[id number]]  Estimated length of stay: past midnight tomorrow  Certification:: I certify this patient will need inpatient services for at least 2 midnights       B Medical/Surgery History Past Medical History:  Diagnosis Date  . Cancer [facility])    Lung cancer with brain metastisis  . Hyperlipidemia   . Hypertension   . Restless legs syndrome (RLS)   . Seasonal allergies   . Smoker    Quit 08/04/12  . Ulcer 1988   Past Surgical History:  Procedure Laterality Date  . APPENDECTOMY  1957   hernia repair at the same time  . [place]  2000, 2002, 2007   bilateral  2x's on right and 1x on the left  . TONSILECTOMY, ADENOIDECTOMY, BILATERAL MYRINGOTOMY AND TUBES  1965     A IV Location/Drains/Wounds Patient Lines/Drains/Airways Status   Active Line/Drains/Airways    Name:   Placement date:   Placement time:   Site:   Days:   Implanted Port 02/22/19 Right Chest   02/22/19    2158    Chest   37   Implanted Port 03/31/19 Right Chest   03/31/19    0932    Chest    less than 1          Intake/Output Last 24 hours  Intake/Output Summary (Last 24 hours) at 03/31/2019 1810 Last data filed at 03/31/2019 1759 Gross per 24 hour  Intake 653.94 ml  Output --  Net 653.94 ml    Labs/Imaging Results for orders placed or performed during the hospital encounter of 03/31/19 (from the past 48 hour(s))  Comprehensive metabolic panel     Status: Abnormal   Collection Time: 03/31/19  8:47 AM  Result Value Ref Range   Sodium 134 (L) 135 - 145 mmol/L   Potassium 2.7 (LL) 3.5 - 5.1 mmol/L    Comment: CRITICAL RESULT CALLED TO, READ BACK BY AND VERIFIED WITH: KUATER,S AT 10:00AM ON 03/31/19 BY FESTERMAN,C    Chloride 91 (L) 98 - 111 mmol/L   CO2 23 22 - 32 mmol/L   Glucose, Bld 99 70 - 99 mg/dL   BUN 6 (L) 8 - 23 mg/dL   Creatinine, Ser 0.66 0.61 - 1.24 mg/dL   Calcium 9.0 8.9 - 10.3 mg/dL   Total Protein 6.1 (L) 6.5 - 8.1 g/dL   Albumin 3.4 (L) 3.5 - 5.0 g/dL   AST 20 15 - 41 U/L   ALT 13 0 - 44 U/L   Alkaline Phosphatase 62 38 - 126 U/L   Total Bilirubin 1.5 (H) 0.3 -  1.2 mg/dL   GFR calc non Af Amer >60 >60 mL/min   GFR calc Af Amer >60 >60 mL/min   Anion gap 20 (H) 5 - 15    Comment: Performed at [facility], [address], [place], [place] [address]  CBC WITH DIFFERENTIAL     Status: Abnormal   Collection Time: 03/31/19  8:47 AM  Result Value Ref Range   WBC 7.8 4.0 - 10.5 K/uL   RBC 4.03 (L) 4.22 - 5.81 MIL/uL   Hemoglobin 13.0 13.0 - 17.0 g/dL   HCT 38.8 (L) 39.0 - 52.0 %   MCV 96.3 80.0 - 100.0 fL   MCH 32.3 26.0 - 34.0 pg   MCHC 33.5 30.0 - 36.0 g/dL   RDW 15.6 (H) 11.5 - 15.5 %   Platelets 229 150 - 400 K/uL   nRBC 0.0 0.0 - 0.2 %   Neutrophils Relative % 79 %   Neutro Abs 6.1 1.7 - 7.7 K/uL   Lymphocytes Relative 6 %   Lymphs Abs 0.5 (L) 0.7 - 4.0 K/uL   Monocytes Relative 15 %   Monocytes Absolute 1.2 (H) 0.1 - 1.0 K/uL   Eosinophils Relative 0 %   Eosinophils Absolute 0.0 0.0 - 0.5 K/uL   Basophils Relative 0 %   Basophils  Absolute 0.0 0.0 - 0.1 K/uL   Immature Granulocytes 0 %   Abs Immature Granulocytes 0.02 0.00 - 0.07 K/uL    Comment: Performed at [facility], [address], [place], [place] [address]  Urinalysis, Complete w Microscopic     Status: Abnormal   Collection Time: 03/31/19  8:47 AM  Result Value Ref Range   Color, Urine YELLOW YELLOW   APPearance CLEAR CLEAR   Specific Gravity, Urine 1.014 1.005 - 1.030   pH 6.0 5.0 - 8.0   Glucose, UA NEGATIVE NEGATIVE mg/dL   Hgb urine dipstick MODERATE (A) NEGATIVE   Bilirubin Urine NEGATIVE NEGATIVE   Ketones, ur 80 (A) NEGATIVE mg/dL   Protein, ur 30 (A) NEGATIVE mg/dL   Nitrite NEGATIVE NEGATIVE   Leukocytes,Ua NEGATIVE NEGATIVE   RBC / HPF >50 (H) 0 - 5 RBC/hpf   WBC, UA 0-5 0 - 5 WBC/hpf   Bacteria, UA FEW (A) NONE SEEN   Squamous Epithelial / LPF 0-5 0 - 5   Mucus PRESENT    Hyaline Casts, UA PRESENT    Ca Oxalate Crys, UA PRESENT    Sperm, UA PRESENT     Comment: Performed at [facility] [address], [place], [place] [address]  Rapid urine drug screen (hospital performed)     Status: None   Collection Time: 03/31/19  8:48 AM  Result Value Ref Range   Opiates NONE DETECTED NONE DETECTED   Cocaine NONE DETECTED NONE DETECTED   Benzodiazepines NONE DETECTED NONE DETECTED   Amphetamines NONE DETECTED NONE DETECTED   Tetrahydrocannabinol NONE DETECTED NONE DETECTED   Barbiturates NONE DETECTED NONE DETECTED    Comment: (NOTE) DRUG SCREEN FOR MEDICAL PURPOSES ONLY.  IF CONFIRMATION IS NEEDED FOR ANY PURPOSE, NOTIFY LAB WITHIN 5 DAYS. LOWEST DETECTABLE LIMITS FOR URINE DRUG SCREEN Drug Class                     Cutoff (ng/mL) Amphetamine and metabolites    1000 Barbiturate and metabolites    200 Benzodiazepine                 030 Tricyclics and metabolites  300 Opiates and metabolites        300 Cocaine and metabolites        300 THC                            50 Performed at [facility], [address] [place], [place]  [address]   EtOH     Status: None   Collection Time: 03/31/19  9:30 AM  Result Value Ref Range   Alcohol, Ethyl (B) <10 <10 mg/dL    Comment: (NOTE) Lowest detectable limit for serum alcohol is 10 mg/dL. For medical purposes only. Performed at [facility], [address] [place], [place] [address]   POC SARS Coronavirus 2 Ag-ED - Nasal Swab (BD Veritor Kit)     Status: None   Collection Time: 03/31/19  9:35 AM  Result Value Ref Range   SARS Coronavirus 2 Ag NEGATIVE NEGATIVE    Comment: (NOTE) SARS-CoV-2 antigen NOT DETECTED.  Negative results are presumptive.  Negative results do not preclude SARS-CoV-2 infection and should not be used as the sole basis for treatment or other patient management decisions, including infection  control decisions, particularly in the presence of clinical signs and  symptoms consistent with COVID-19, or in those who have been in contact with the virus.  Negative results must be combined with clinical observations, patient history, and epidemiological information. The expected result is Negative. Fact Sheet for Patients: PodPark.tn Fact Sheet for Healthcare Providers: GiftContent.is This test is not yet approved or cleared by the [place] FDA and  has been authorized for detection and/or diagnosis of SARS-CoV-2 by FDA under an Emergency Use Authorization (EUA).  This EUA will remain in effect (meaning this test can be used) for the duration of  the COVID-19 de claration under Section 564(b)(1) of the Act, 21 U.S.C. section 360bbb-3(b)(1), unless the authorization is terminated or revoked sooner.   CBG monitoring, ED     Status: None   Collection Time: 03/31/19  9:49 AM  Result Value Ref Range   Glucose-Capillary 90 70 - 99 mg/dL  Magnesium     Status: Abnormal   Collection Time: 03/31/19 10:09 AM  Result Value Ref Range   Magnesium 1.6 (L) 1.7 - 2.4 mg/dL    Comment: Performed at  [facility], [address], [place], [place] [address]  Ammonia     Status: None   Collection Time: 03/31/19  3:49 PM  Result Value Ref Range   Ammonia 24 9 - 35 umol/L    Comment: Performed at [facility], [address] [place], [place] [address]  CK     Status: None   Collection Time: 03/31/19  3:49 PM  Result Value Ref Range   Total CK 363 49 - 397 U/L    Comment: Performed at [facility], [address], [place], [place] [address]  CBC     Status: Abnormal   Collection Time: 03/31/19  3:49 PM  Result Value Ref Range   WBC 10.1 4.0 - 10.5 K/uL   RBC 4.12 (L) 4.22 - 5.81 MIL/uL   Hemoglobin 13.1 13.0 - 17.0 g/dL   HCT 40.0 39.0 - 52.0 %   MCV 97.1 80.0 - 100.0 fL   MCH 31.8 26.0 - 34.0 pg   MCHC 32.8 30.0 - 36.0 g/dL   RDW 15.6 (H) 11.5 - 15.5 %   Platelets 265 150 - 400 K/uL   nRBC 0.0 0.0 - 0.2 %  Comment: Performed at [facility], [address] [place], [place] [address]  Creatinine, serum     Status: None   Collection Time: 03/31/19  3:49 PM  Result Value Ref Range   Creatinine, Ser 0.84 0.61 - 1.24 mg/dL   GFR calc non Af Amer >60 >60 mL/min   GFR calc Af Amer >60 >60 mL/min    Comment: Performed at [facility], [address], [place], [place] [address]  TSH     Status: None   Collection Time: 03/31/19  3:49 PM  Result Value Ref Range   TSH 0.986 0.350 - 4.500 uIU/mL    Comment: Performed by a 3rd Generation assay with a functional sensitivity of <=0.01 uIU/mL. Performed at [facility], [address] [place], [place] [address]   Vitamin B12     Status: None   Collection Time: 03/31/19  3:49 PM  Result Value Ref Range   Vitamin B-12 728 180 - 914 pg/mL    Comment: (NOTE) This assay is not validated for testing neonatal or myeloproliferative syndrome specimens for Vitamin B12 levels. Performed at [facility], [address], [place], [place] [address]    CT Head Wo Contrast  Result Date: 03/31/2019 CLINICAL DATA:  Pt's mother called ems for altered  mental status. She was going to pick him up for his radiation tx today due to brain ca and found him "unresponsive". Pt will respond to his name but nothing else.Focal neuro deficit, > 6 hrs, stroke suspected EXAM: CT HEAD WITHOUT CONTRAST TECHNIQUE: Contiguous axial images were obtained from the base of the skull through the vertex without intravenous contrast. COMPARISON:  Head CT 03/04/2019, brain MRI 10/17/2018 FINDINGS: Brain: Patient status post LEFT craniotomy and tumor section. Encephalomalacia and vasogenic edema within the LEFT parietal lobe and LEFT temporal lobe are similar to similar comparison exam. No acute intracranial hemorrhage. No midline shift or mass effect. No CT evidence of cortical infarction. Vascular: No hyperdense vessel or unexpected calcification. Skull: LEFT parietal craniotomy. No acute findings Sinuses/Orbits: No acute finding. Other: IMPRESSION: 1. No CT evidence of acute cortical infarction. 2. Postsurgical encephalomalacia and vasogenic edema in the LEFT temporoparietal lobe unchanged from comparison exam. No acute intracranial findings. Electronically Signed   By: Suzy Bouchard M.D.   On: 03/31/2019 10:28   DG CHEST PORT 1 VIEW  Result Date: 03/31/2019 CLINICAL DATA:  Chest congestion, lethargy, recent seizure EXAM: PORTABLE CHEST 1 VIEW COMPARISON:  03/31/2019 FINDINGS: Unchanged AP portable examination. No acute appearing airspace opacity. Right chest port catheter. Heart mediastinum are unremarkable. IMPRESSION: Unchanged AP portable examination. No acute appearing airspace opacity. Electronically Signed   By: Eddie Candle M.D.   On: 03/31/2019 16:58   DG Chest Portable 1 View  Result Date: 03/31/2019 CLINICAL DATA:  Hypoxia EXAM: PORTABLE CHEST 1 VIEW COMPARISON:  03/04/2019 FINDINGS: Right chest wall port catheter is unchanged. Probable minor atelectasis at the lung bases. Stable cardiomediastinal contours. No pleural effusion or pneumothorax. IMPRESSION: Minor  bibasilar atelectasis. Electronically Signed   By: Macy Mis M.D.   On: 03/31/2019 09:12    Pending Labs Unresulted Labs (From admission, onward)    Start     Ordered   04/07/19 0500  Creatinine, serum  (enoxaparin (LOVENOX)    CrCl >/= 30 ml/min)  Weekly,   R    Comments: while on enoxaparin therapy    03/31/19 1338   04/01/19 0500  Magnesium  Tomorrow morning,   R     03/31/19 1338  04/01/19 8185  Basic metabolic panel  Tomorrow morning,   R     03/31/19 1338   04/01/19 0500  CBC  Tomorrow morning,   R     03/31/19 1338   03/31/19 1706  MRSA PCR Screening  Once,   STAT    Question:  Patient immune status  Answer:  Normal   03/31/19 1706   03/31/19 1336  HIV Antibody (routine testing w rflx)  (HIV Antibody (Routine testing w reflex) panel)  Once,   STAT     03/31/19 1338   03/31/19 1336  Hemoglobin A1c  Once,   STAT    Comments: To assess prior glycemic control    03/31/19 1338   03/31/19 1205  SARS CORONAVIRUS 2 (TAT 6-24 HRS) Nasopharyngeal Nasopharyngeal Swab  (Tier 3 (TAT 6-24 hrs))  Once,   STAT    Question Answer Comment  Is this test for diagnosis or screening Screening   Symptomatic for COVID-19 as defined by CDC No   Hospitalized for COVID-19 No   Admitted to ICU for COVID-19 No   Previously tested for COVID-19 Unknown   Resident in a congregate (group) care setting No   Employed in healthcare setting No      03/31/19 1205          Vitals/Pain Today's Vitals   03/31/19 1600 03/31/19 1630 03/31/19 1700 03/31/19 1730  BP: (!) 117/96 129/90 122/84 (!) 123/91  Pulse: (!) 104 (!) 114 (!) 119 (!) 120  Resp: (!) 22 (!) 27 (!) 30 (!) 32  Temp:      TempSrc:      SpO2: 93% 93% 93% 97%  Weight:      Height:        Isolation Precautions No active isolations  Medications Medications  0.9 %  sodium chloride infusion ( Intravenous Stopped 03/31/19 1257)  potassium chloride 10 mEq in 100 mL IVPB (10 mEq Intravenous New Bag/Given 03/31/19 1759)  aspirin  chewable tablet 81 mg (81 mg Oral Not Given 03/31/19 1400)  montelukast (SINGULAIR) tablet 10 mg (has no administration in time range)  levETIRAcetam (KEPPRA) IVPB 500 mg/100 mL premix (500 mg Intravenous Not Given 03/31/19 1614)  enoxaparin (LOVENOX) injection 40 mg (40 mg Subcutaneous Given 03/31/19 1656)  0.9 %  sodium chloride infusion ( Intravenous New Bag/Given 03/31/19 1348)  acetaminophen (TYLENOL) tablet 650 mg (has no administration in time range)    Or  acetaminophen (TYLENOL) suppository 650 mg (has no administration in time range)  ondansetron (ZOFRAN) tablet 4 mg (has no administration in time range)    Or  ondansetron (ZOFRAN) injection 4 mg (has no administration in time range)  insulin aspart (novoLOG) injection 0-6 Units (0 Units Subcutaneous Not Given 03/31/19 1656)  insulin aspart (novoLOG) injection 0-5 Units (has no administration in time range)  labetalol (NORMODYNE) injection 10 mg (has no administration in time range)  LORazepam (ATIVAN) injection 2 mg (2 mg Intravenous Given 03/31/19 1552)  LORazepam (ATIVAN) 2 MG/ML injection (has no administration in time range)  sodium chloride flush (NS) 0.9 % injection 10-40 mL (has no administration in time range)  sodium chloride flush (NS) 0.9 % injection 10-40 mL (has no administration in time range)  Chlorhexidine Gluconate Cloth 2 % PADS 6 each (has no administration in time range)  potassium chloride 10 mEq in 100 mL IVPB (0 mEq Intravenous Stopped 03/31/19 1144)  magnesium sulfate IVPB 2 g 50 mL (0 g Intravenous Stopped 03/31/19 1257)  levETIRAcetam (KEPPRA) 1000  MG/100ML IVPB (  Stopped 03/31/19 1611)  LORazepam (ATIVAN) injection 2 mg (2 mg Intravenous Given 03/31/19 1557)    Mobility walks High fall risk   Focused Assessments    R Recommendations: See Admitting Provider Note  Report given to:   Additional Notes

## 2019-03-31 NOTE — ED Triage Notes (Signed)
Pt's mother called ems for altered mental status. She was going to pick him up for his radiation tx today due to brain ca and found him "unresponsive". Pt will respond to his name but nothing else.

## 2019-03-31 NOTE — ED Notes (Signed)
Dr. Kohut at bedside 

## 2019-03-31 NOTE — ED Notes (Signed)
Pt found to be actively seizing in room, called for additional help. Pt mouth suctioned with yanker suction, scant amount of fluid returned.

## 2019-03-31 NOTE — Consult Note (Signed)
Biehle A. Kevin Laughter, MD     [email or web address]          Kevin [name] is an 64 y.o. male.   ASSESSMENT/PLAN: 1.  Acute encephalopathy of unclear etiology.  Patient does have brain insults which increases the risk for seizures especially with evidence of eye deviation towards the left.  The patient also seem to have thick productive sputum with frequent coughing suggestive of possible bronchitis/pneumonia not seen on imaging.  This could also could be a cause for the patient encephalopathy.  No other metabolic changes are noted to explain the patient encephalopathy.  The patient MRI changes seems unchanged from the previous scan done a month ago therefore edema seems unlikely to explain the patient's altered mental status changes by itself.  An EEG is obtained.  The patient's Keppra will be increased.  Consider repeat chest x-ray.  2.  Metastatic lung cancer with brain metastasis.  3.  Heavy snoring noted suggestive of possible obstructive sleep apnea syndrome    Patient is a 64 year old white male who presents with a 3-day history of progressive unresponsiveness and alteration in mentation.  It appears that the patient has been maintained on Keppra.  This appears to be started recently for concerns that the patient may have seizures related to history of metastatic lung cancer to the brain.  Patient work-up on initial presentation has been mostly unrevealing.  There is no evidence of metabolic derangements to explain the encephalopathy/altered mental status.  Imaging has been unchanged from previous scans.  No reports of convulsions that are witnessed.  No clear focal laterality on examination per history.  The history/review of systems is nobtainable due to the current stupor.  GENERAL: Patient is thin and unresponsive  HEENT: He has significant mitrognathtia; neck is supple  ABDOMEN: soft  EXTREMITIES: No edema   BACK: Normal  SKIN: Normal by inspection.      MENTAL STATUS: Eyes are closed and there is no eye-opening even to deep painful stimuli.  There is essentially no verbal output to pain.  The patient does not follow commands.  CRANIAL NERVES: Eyes are deviated towards the left but he does have full extraocular movements with oculocephalic reflexes.  Pupils are midposition and reactive.  Corneal reflexes are intact.  Cough and gag reflexes are present.  Facial muscle strength is symmetric.  MOTOR: Bulk and tone are normal throughout.  The patient withdraws purposefully to deep painful stimuli.  Again, he does not follow commands.  COORDINATION: There is no evidence of parkinsonism.  No tremors or dysmetria noted.  No negative myoclonus or myoclonus.  REFLEXES: Deep tendon reflexes are symmetrical but brisk throughout.  SENSATION: He responds to painful stimuli bilaterally.   Blood pressure (!) 117/96, pulse (!) 104, temperature 98 F (36.7 C), temperature source Oral, resp. rate (!) 22, height 6' (1.829 m), weight 91 kg, SpO2 93 %.  Past Medical History:  Diagnosis Date  . Cancer [facility])    Lung cancer with brain metastisis  . Hyperlipidemia   . Hypertension   . Restless legs syndrome (RLS)   . Seasonal allergies   . Smoker    Quit 08/04/12  . Ulcer 1988    Past Surgical History:  Procedure Laterality Date  . APPENDECTOMY  1957   hernia repair at the same time  . Kevin [name]  2000, 2002, 2007   bilateral  2x's on right and 1x on the left  . TONSILECTOMY, ADENOIDECTOMY, [place]  Family History  Problem Relation Age of Onset  . Lung cancer Father 56  . Colon polyps Brother   . Colon cancer Paternal Aunt   . Colon cancer Cousin   . Esophageal cancer Neg Hx   . Rectal cancer Neg Hx   . Stomach cancer Neg Hx     Social History:  reports that he quit smoking about 2 years ago. His smoking use included cigarettes and cigars. He smoked 0.50 packs per day. He has never used smokeless  tobacco. He reports current alcohol use of about 22.0 standard drinks of alcohol per week. He reports that he does not use drugs.  Allergies: No Known Allergies  Medications: Prior to Admission medications   Medication Sig Start Date End Date Taking? Authorizing Provider  aspirin 81 MG tablet Take 81 mg by mouth daily.    [provider]  Cholecalciferol (VITAMIN D3) 5000 units TABS Take 1 tablet by mouth daily.     [provider]  ciprofloxacin (CIPRO) 500 MG tablet Take 1 tablet (500 mg total) by mouth 2 (two) times daily. One po bid x 7 days 03/04/19   Milton Ferguson, MD  fluconazole (DIFLUCAN) 100 MG tablet Take 100 mg by mouth daily. 14 day course starting on 02/26/2019 02/26/19   [provider]  furosemide (LASIX) 40 MG tablet Take 1 tablet (40 mg total) by mouth daily. 09/23/12   Lanae Crumbly, PA-C  levETIRAcetam (KEPPRA) 500 MG tablet Take 1 tablet (500 mg total) by mouth 2 (two) times daily. 03/04/19   Milton Ferguson, MD  metFORMIN (GLUCOPHAGE-XR) 500 MG 24 hr tablet Take 500 mg by mouth daily. 01/16/19   [provider]  montelukast (SINGULAIR) 10 MG tablet Take 10 mg by mouth at bedtime.    [provider]  Multiple Vitamin (MULTIVITAMIN) tablet Take 1 tablet by mouth daily.    [provider]  Omega-3 Fatty Acids (FISH OIL) 1000 MG CAPS Take 1,000 mg by mouth daily.     [provider]  ondansetron (ZOFRAN) 8 MG tablet Take 8 mg by mouth every 8 (eight) hours as needed for nausea or vomiting.  03/26/19   [provider]  potassium chloride SA (KLOR-CON) 20 MEQ tablet Take 20 mEq by mouth daily. 02/04/19   [provider]  pravastatin (PRAVACHOL) 40 MG tablet Take 40 mg by mouth daily.    [provider]  telmisartan (MICARDIS) 80 MG tablet Take 80 mg by mouth daily.  09/18/16   [provider]  zolpidem (AMBIEN) 10 MG tablet Take 10 mg by mouth at bedtime as needed for sleep. 1/2 tab as  needed    [provider]  hydrochlorothiazide (HYDRODIURIL) 25 MG tablet Take 1 tablet (25 mg total) by mouth daily. 07/24/12 09/23/12  Susy Frizzle, MD    Scheduled Meds: . aspirin  81 mg Oral Daily  . Chlorhexidine Gluconate Cloth  6 each Topical Daily  . enoxaparin (LOVENOX) injection  40 mg Subcutaneous Q24H  . insulin aspart  0-5 Units Subcutaneous QHS  . insulin aspart  0-6 Units Subcutaneous TID WC  . LORazepam      . montelukast  10 mg Oral QHS  . sodium chloride flush  10-40 mL Intracatheter Q12H   Continuous Infusions: . sodium chloride Stopped (03/31/19 1257)  . sodium chloride 75 mL/hr at 03/31/19 1348  . levETIRAcetam    . potassium chloride 10 mEq (03/31/19 1655)   PRN Meds:.sodium chloride, acetaminophen **OR** acetaminophen, labetalol,  LORazepam, ondansetron **OR** ondansetron (ZOFRAN) IV, sodium chloride flush     Results for orders placed or performed during the hospital encounter of 03/31/19 (from the past 48 hour(s))  Comprehensive metabolic panel     Status: Abnormal   Collection Time: 03/31/19  8:47 AM  Result Value Ref Range   Sodium 134 (L) 135 - 145 mmol/L   Potassium 2.7 (LL) 3.5 - 5.1 mmol/L    Comment: CRITICAL RESULT CALLED TO, READ BACK BY AND VERIFIED WITH: KUATER,S AT 10:00AM ON 03/31/19 BY FESTERMAN,C    Chloride 91 (L) 98 - 111 mmol/L   CO2 23 22 - 32 mmol/L   Glucose, Bld 99 70 - 99 mg/dL   BUN 6 (L) 8 - 23 mg/dL   Creatinine, Ser 0.66 0.61 - 1.24 mg/dL   Calcium 9.0 8.9 - 10.3 mg/dL   Total Protein 6.1 (L) 6.5 - 8.1 g/dL   Albumin 3.4 (L) 3.5 - 5.0 g/dL   AST 20 15 - 41 U/L   ALT 13 0 - 44 U/L   Alkaline Phosphatase 62 38 - 126 U/L   Total Bilirubin 1.5 (H) 0.3 - 1.2 mg/dL   GFR calc non Af Amer >60 >60 mL/min   GFR calc Af Amer >60 >60 mL/min   Anion gap 20 (H) 5 - 15    Comment: Performed at [facility], [address], [place], [place] [address]  CBC WITH DIFFERENTIAL     Status: Abnormal   Collection Time:  03/31/19  8:47 AM  Result Value Ref Range   WBC 7.8 4.0 - 10.5 K/uL   RBC 4.03 (L) 4.22 - 5.81 MIL/uL   Hemoglobin 13.0 13.0 - 17.0 g/dL   HCT 38.8 (L) 39.0 - 52.0 %   MCV 96.3 80.0 - 100.0 fL   MCH 32.3 26.0 - 34.0 pg   MCHC 33.5 30.0 - 36.0 g/dL   RDW 15.6 (H) 11.5 - 15.5 %   Platelets 229 150 - 400 K/uL   nRBC 0.0 0.0 - 0.2 %   Neutrophils Relative % 79 %   Neutro Abs 6.1 1.7 - 7.7 K/uL   Lymphocytes Relative 6 %   Lymphs Abs 0.5 (L) 0.7 - 4.0 K/uL   Monocytes Relative 15 %   Monocytes Absolute 1.2 (H) 0.1 - 1.0 K/uL   Eosinophils Relative 0 %   Eosinophils Absolute 0.0 0.0 - 0.5 K/uL   Basophils Relative 0 %   Basophils Absolute 0.0 0.0 - 0.1 K/uL   Immature Granulocytes 0 %   Abs Immature Granulocytes 0.02 0.00 - 0.07 K/uL    Comment: Performed at [facility], [address], [place], [place] [address]  Urinalysis, Complete w Microscopic     Status: Abnormal   Collection Time: 03/31/19  8:47 AM  Result Value Ref Range   Color, Urine YELLOW YELLOW   APPearance CLEAR CLEAR   Specific Gravity, Urine 1.014 1.005 - 1.030   pH 6.0 5.0 - 8.0   Glucose, UA NEGATIVE NEGATIVE mg/dL   Hgb urine dipstick MODERATE (A) NEGATIVE   Bilirubin Urine NEGATIVE NEGATIVE   Ketones, ur 80 (A) NEGATIVE mg/dL   Protein, ur 30 (A) NEGATIVE mg/dL   Nitrite NEGATIVE NEGATIVE   Leukocytes,Ua NEGATIVE NEGATIVE   RBC / HPF >50 (H) 0 - 5 RBC/hpf   WBC, UA 0-5 0 - 5 WBC/hpf   Bacteria, UA FEW (A) NONE SEEN   Squamous Epithelial / LPF 0-5 0 - 5   Mucus PRESENT  Hyaline Casts, UA PRESENT    Ca Oxalate Crys, UA PRESENT    Sperm, UA PRESENT     Comment: Performed at [facility], [address] [place], [place] [address]  Rapid urine drug screen (hospital performed)     Status: None   Collection Time: 03/31/19  8:48 AM  Result Value Ref Range   Opiates NONE DETECTED NONE DETECTED   Cocaine NONE DETECTED NONE DETECTED   Benzodiazepines NONE DETECTED NONE DETECTED   Amphetamines NONE DETECTED  NONE DETECTED   Tetrahydrocannabinol NONE DETECTED NONE DETECTED   Barbiturates NONE DETECTED NONE DETECTED    Comment: (NOTE) DRUG SCREEN FOR MEDICAL PURPOSES ONLY.  IF CONFIRMATION IS NEEDED FOR ANY PURPOSE, NOTIFY LAB WITHIN 5 DAYS. LOWEST DETECTABLE LIMITS FOR URINE DRUG SCREEN Drug Class                     Cutoff (ng/mL) Amphetamine and metabolites    1000 Barbiturate and metabolites    200 Benzodiazepine                 262 Tricyclics and metabolites     300 Opiates and metabolites        300 Cocaine and metabolites        300 THC                            50 Performed at [facility], [address], [place], [place] [address]   EtOH     Status: None   Collection Time: 03/31/19  9:30 AM  Result Value Ref Range   Alcohol, Ethyl (B) <10 <10 mg/dL    Comment: (NOTE) Lowest detectable limit for serum alcohol is 10 mg/dL. For medical purposes only. Performed at [facility], [address] [place], [place] [address]   POC SARS Coronavirus 2 Ag-ED - Nasal Swab (BD Veritor Kit)     Status: None   Collection Time: 03/31/19  9:35 AM  Result Value Ref Range   SARS Coronavirus 2 Ag NEGATIVE NEGATIVE    Comment: (NOTE) SARS-CoV-2 antigen NOT DETECTED.  Negative results are presumptive.  Negative results do not preclude SARS-CoV-2 infection and should not be used as the sole basis for treatment or other patient management decisions, including infection  control decisions, particularly in the presence of clinical signs and  symptoms consistent with COVID-19, or in those who have been in contact with the virus.  Negative results must be combined with clinical observations, patient history, and epidemiological information. The expected result is Negative. Fact Sheet for Patients: PodPark.tn Fact Sheet for Healthcare Providers: GiftContent.is This test is not yet approved or cleared by the [place] FDA and  has been  authorized for detection and/or diagnosis of SARS-CoV-2 by FDA under an Emergency Use Authorization (EUA).  This EUA will remain in effect (meaning this test can be used) for the duration of  the COVID-19 de claration under Section 564(b)(1) of the Act, 21 U.S.C. section 360bbb-3(b)(1), unless the authorization is terminated or revoked sooner.   CBG monitoring, ED     Status: None   Collection Time: 03/31/19  9:49 AM  Result Value Ref Range   Glucose-Capillary 90 70 - 99 mg/dL  Magnesium     Status: Abnormal   Collection Time: 03/31/19 10:09 AM  Result Value Ref Range   Magnesium 1.6 (L) 1.7 - 2.4 mg/dL    Comment: Performed at [facility], [address] [place], [place] [address]  Ammonia     Status: None   Collection Time: 03/31/19  3:49 PM  Result Value Ref Range   Ammonia 24 9 - 35 umol/L    Comment: Performed at [facility], [address] [place], [place] [address]  CK     Status: None   Collection Time: 03/31/19  3:49 PM  Result Value Ref Range   Total CK 363 49 - 397 U/L    Comment: Performed at [facility], [address] [place], [place] [address]  CBC     Status: Abnormal   Collection Time: 03/31/19  3:49 PM  Result Value Ref Range   WBC 10.1 4.0 - 10.5 K/uL   RBC 4.12 (L) 4.22 - 5.81 MIL/uL   Hemoglobin 13.1 13.0 - 17.0 g/dL   HCT 40.0 39.0 - 52.0 %   MCV 97.1 80.0 - 100.0 fL   MCH 31.8 26.0 - 34.0 pg   MCHC 32.8 30.0 - 36.0 g/dL   RDW 15.6 (H) 11.5 - 15.5 %   Platelets 265 150 - 400 K/uL   nRBC 0.0 0.0 - 0.2 %    Comment: Performed at [facility], [address] [place], [place] [address]  Creatinine, serum     Status: None   Collection Time: 03/31/19  3:49 PM  Result Value Ref Range   Creatinine, Ser 0.84 0.61 - 1.24 mg/dL   GFR calc non Af Amer >60 >60 mL/min   GFR calc Af Amer >60 >60 mL/min    Comment: Performed at [facility], [address], [place], [place] [address]  TSH     Status: None   Collection Time: 03/31/19  3:49 PM  Result Value Ref  Range   TSH 0.986 0.350 - 4.500 uIU/mL    Comment: Performed by a 3rd Generation assay with a functional sensitivity of <=0.01 uIU/mL. Performed at [facility], [address] [place], [place] [address]   Vitamin B12     Status: None   Collection Time: 03/31/19  3:49 PM  Result Value Ref Range   Vitamin B-12 728 180 - 914 pg/mL    Comment: (NOTE) This assay is not validated for testing neonatal or myeloproliferative syndrome specimens for Vitamin B12 levels. Performed at [facility], [address], [place], [place] [address]     Studies/Results:  HEAD CT COMPARISON:  Head CT 03/04/2019, brain MRI 10/17/2018  FINDINGS: Brain: Patient status post LEFT craniotomy and tumor section. Encephalomalacia and vasogenic edema within the LEFT parietal lobe and LEFT temporal lobe are similar to similar comparison exam. No acute intracranial hemorrhage. No midline shift or mass effect.  No CT evidence of cortical infarction.  Vascular: No hyperdense vessel or unexpected calcification.  Skull: LEFT parietal craniotomy. No acute findings  Sinuses/Orbits: No acute finding.  Other:  IMPRESSION: 1. No CT evidence of acute cortical infarction. 2. Postsurgical encephalomalacia and vasogenic edema in the LEFT temporoparietal lobe unchanged from comparison exam. No acute    The head CT scan is reviewed in person and compared to the previous scan done a month ago.  There is evidence of significant hypodensity involving the left temporal region and left parietal occipital region.  This is rather extensive but no evidence of mass-effect.  There is an area of encephalomalacia involving the left occipital parietal region associated with craniotomy defect.  This is moderate in size.  No acute findings are noted.  Iisha Soyars A. Kevin [name], M.D.  Diplomate, Tax adviser of Psychiatry  and Neurology ( Neurology). 03/31/2019, 5:29 PM

## 2019-03-31 NOTE — ED Notes (Signed)
Pt resting at this time. No seizure activity noted.

## 2019-03-31 NOTE — ED Provider Notes (Addendum)
[facility] Provider Note   CSN: [id number] Arrival date & time: 03/31/19  0809     History Chief Complaint  Patient presents with  . Altered Mental Status    Kevin [name] is a 64 y.o. male.  The history is provided by the EMS personnel and medical records.  Altered Mental Status Presenting symptoms: partial responsiveness   Most recent episode:  Today (Pt found by mother this am barely responsive. Was picking him up for his radiation tx. Lung CA with brain metastatic disease) Episode history:  Unable to specify Progression:  Unchanged Chronicity:  New Context comment:  Keppra recently added over concern about possible new onset seizures      Past Medical History:  Diagnosis Date  . Cancer [facility])    Lung cancer with brain metastisis  . Hyperlipidemia   . Hypertension   . Restless legs syndrome (RLS)   . Seasonal allergies   . Smoker    Quit 08/04/12  . Ulcer 1988    Patient Active Problem List   Diagnosis Date Noted  . Tobacco abuse 07/23/2012  . HTN (hypertension) 07/23/2012  . RLS (restless legs syndrome) 07/23/2012  . HLD (hyperlipidemia) 07/23/2012  . Hypertension   . Restless legs syndrome (RLS)     Past Surgical History:  Procedure Laterality Date  . APPENDECTOMY  1957   hernia repair at the same time  . [place]  2000, 2002, 2007   bilateral  2x's on right and 1x on the left  . TONSILECTOMY, ADENOIDECTOMY, BILATERAL MYRINGOTOMY AND TUBES  1965       Family History  Problem Relation Age of Onset  . Lung cancer Father 56  . Colon polyps Brother   . Colon cancer Paternal Aunt   . Colon cancer Cousin   . Esophageal cancer Neg Hx   . Rectal cancer Neg Hx   . Stomach cancer Neg Hx     Social History   Tobacco Use  . Smoking status: Former Smoker    Packs/day: 0.50    Types: Cigarettes, Cigars    Quit date: 07/25/2016    Years since quitting: 2.6  . Smokeless tobacco: Never Used  Substance Use Topics  .  Alcohol use: Yes    Alcohol/week: 22.0 standard drinks    Types: 12 Cans of beer, 10 Shots of liquor per week  . Drug use: No    Home Medications Prior to Admission medications   Medication Sig Start Date End Date Taking? Authorizing Provider  aspirin 81 MG tablet Take 81 mg by mouth daily.    [provider]  Cholecalciferol (VITAMIN D3) 5000 units TABS Take 1 tablet by mouth daily.     [provider]  ciprofloxacin (CIPRO) 500 MG tablet Take 1 tablet (500 mg total) by mouth 2 (two) times daily. One po bid x 7 days 03/04/19   Milton Ferguson, MD  fluconazole (DIFLUCAN) 100 MG tablet Take 100 mg by mouth daily. 14 day course starting on 02/26/2019 02/26/19   [provider]  furosemide (LASIX) 40 MG tablet Take 1 tablet (40 mg total) by mouth daily. 09/23/12   Lanae Crumbly, PA-C  levETIRAcetam (KEPPRA) 500 MG tablet Take 1 tablet (500 mg total) by mouth 2 (two) times daily. 03/04/19   Milton Ferguson, MD  metFORMIN (GLUCOPHAGE-XR) 500 MG 24 hr tablet Take 500 mg by mouth daily. 01/16/19   [provider]  montelukast (SINGULAIR) 10 MG tablet Take 10 mg by  mouth at bedtime.    [provider]  Multiple Vitamin (MULTIVITAMIN) tablet Take 1 tablet by mouth daily.    [provider]  Omega-3 Fatty Acids (FISH OIL) 1000 MG CAPS Take 1,000 mg by mouth daily.     [provider]  potassium chloride SA (KLOR-CON) 20 MEQ tablet Take 20 mEq by mouth daily. 02/04/19   [provider]  pravastatin (PRAVACHOL) 40 MG tablet Take 40 mg by mouth daily.    [provider]  telmisartan (MICARDIS) 80 MG tablet Take 80 mg by mouth daily.  09/18/16   [provider]  zolpidem (AMBIEN) 10 MG tablet Take 10 mg by mouth at bedtime as needed for sleep. 1/2 tab as needed    [provider]  hydrochlorothiazide (HYDRODIURIL) 25 MG tablet Take 1 tablet (25 mg total) by mouth daily. 07/24/12 09/23/12  Susy Frizzle, MD     Allergies    Patient has no known allergies.  Review of Systems   Review of Systems  Unable to perform ROS: Mental status change    Physical Exam Updated Vital Signs BP 122/86   Pulse 93   Temp 98 F (36.7 C) (Oral)   Resp 19   Ht 6' (1.829 m)   Wt 91 kg   SpO2 99%   BMI 27.21 kg/m   Physical Exam Vitals and nursing note reviewed.  Constitutional:      Appearance: He is well-developed. He is ill-appearing.  HENT:     Head: Normocephalic and atraumatic.  Eyes:     Pupils: Pupils are equal, round, and reactive to light.     Comments: Unable to assess EOM's due to AMS.  Leftward gaze, holding right eyelid closed.  Cardiovascular:     Rate and Rhythm: Normal rate and regular rhythm.     Heart sounds: Normal heart sounds.  Pulmonary:     Effort: Pulmonary effort is normal.     Breath sounds: Rhonchi present. No wheezing.     Comments: Rhonchi bilateral lung fields Abdominal:     General: Bowel sounds are normal. There is no distension.     Palpations: Abdomen is soft.     Tenderness: There is no abdominal tenderness. There is no guarding.  Musculoskeletal:        General: No tenderness.     Cervical back: Normal range of motion.  Skin:    General: Skin is warm and dry.  Neurological:     Cranial Nerves: Facial asymmetry present.     Comments: Unable to assess, AMS, not able to follow commands.  Will respond with brief yes/no or grunt.  Otherwise non verbal. Right eyelid closed with leftward gaze.     ED Results / Procedures / Treatments   Labs (all labs ordered are listed, but only abnormal results are displayed) Labs Reviewed  COMPREHENSIVE METABOLIC PANEL - Abnormal; Notable for the following components:      Result Value   Sodium 134 (*)    Potassium 2.7 (*)    Chloride 91 (*)    BUN 6 (*)    Total Protein 6.1 (*)    Albumin 3.4 (*)    Total Bilirubin 1.5 (*)    Anion gap 20 (*)    All other components within normal limits  CBC WITH  DIFFERENTIAL/PLATELET - Abnormal; Notable for the following components:   RBC 4.03 (*)    HCT 38.8 (*)    RDW 15.6 (*)    Lymphs Abs 0.5 (*)  Monocytes Absolute 1.2 (*)    All other components within normal limits  URINALYSIS, COMPLETE (UACMP) WITH MICROSCOPIC - Abnormal; Notable for the following components:   Hgb urine dipstick MODERATE (*)    Ketones, ur 80 (*)    Protein, ur 30 (*)    RBC / HPF >50 (*)    Bacteria, UA FEW (*)    All other components within normal limits  MAGNESIUM - Abnormal; Notable for the following components:   Magnesium 1.6 (*)    All other components within normal limits  ETHANOL  RAPID URINE DRUG SCREEN, HOSP PERFORMED  AMMONIA  CK  CBG MONITORING, ED  POC SARS CORONAVIRUS 2 AG -  ED    EKG EKG Interpretation  Date/Time:  Monday March 31 2019 10:16:46 EST Ventricular Rate:  93 PR Interval:    QRS Duration: 89 QT Interval:  364 QTC Calculation: 453 R Axis:   -26 Text Interpretation: Sinus rhythm Abnormal R-wave progression, early transition Inferior infarct, old Confirmed by Virgel Manifold [phone]) on 03/31/2019 11:56:11 AM   Radiology CT Head Wo Contrast  Result Date: 03/31/2019 CLINICAL DATA:  Pt's mother called ems for altered mental status. She was going to pick him up for his radiation tx today due to brain ca and found him "unresponsive". Pt will respond to his name but nothing else.Focal neuro deficit, > 6 hrs, stroke suspected EXAM: CT HEAD WITHOUT CONTRAST TECHNIQUE: Contiguous axial images were obtained from the base of the skull through the vertex without intravenous contrast. COMPARISON:  Head CT 03/04/2019, brain MRI 10/17/2018 FINDINGS: Brain: Patient status post LEFT craniotomy and tumor section. Encephalomalacia and vasogenic edema within the LEFT parietal lobe and LEFT temporal lobe are similar to similar comparison exam. No acute intracranial hemorrhage. No midline shift or mass effect. No CT evidence of cortical infarction.  Vascular: No hyperdense vessel or unexpected calcification. Skull: LEFT parietal craniotomy. No acute findings Sinuses/Orbits: No acute finding. Other: IMPRESSION: 1. No CT evidence of acute cortical infarction. 2. Postsurgical encephalomalacia and vasogenic edema in the LEFT temporoparietal lobe unchanged from comparison exam. No acute intracranial findings. Electronically Signed   By: Suzy Bouchard M.D.   On: 03/31/2019 10:28   DG Chest Portable 1 View  Result Date: 03/31/2019 CLINICAL DATA:  Hypoxia EXAM: PORTABLE CHEST 1 VIEW COMPARISON:  03/04/2019 FINDINGS: Right chest wall port catheter is unchanged. Probable minor atelectasis at the lung bases. Stable cardiomediastinal contours. No pleural effusion or pneumothorax. IMPRESSION: Minor bibasilar atelectasis. Electronically Signed   By: Macy Mis M.D.   On: 03/31/2019 09:12    Procedures Procedures (including critical care time)  Medications Ordered in ED Medications  potassium chloride 10 mEq in 100 mL IVPB (10 mEq Intravenous New Bag/Given 03/31/19 1031)  0.9 %  sodium chloride infusion (500 mLs Intravenous New Bag/Given 03/31/19 1031)    ED Course  I have reviewed the triage vital signs and the nursing notes.  Pertinent labs & imaging results that were available during my care of the patient were reviewed by me and considered in my medical decision making (see chart for details).    MDM Rules/Calculators/A&P                      Pt with metastatic lung ca currently undergoing chest radiation tx.  Recent MRI and CT imaging of brain reveals improvement in brain metastatic disease.  Recent addition of keppra due to suspected new onset seizures.  Unable to get full neuro exam  secondary to AMS.  Unknown last known well, found by mother this am with AMS.  Not code stroke, possible cva vs post ictal state.  Labs, CT imaging pending. Additionally pt was initially hypoxic on presentation, rhonchi present, cxr/ covid testing pending.     Labs and imaging reviewed, hypokalemia, hypomagnesemia, but no obvious source for AMS.  No acute hemorrhagic stroke.  With information documented below given by brother, with slowly worsening symptoms in recent weeks, suspect metabolic source.  He has had a brain MRI within the last 30 days which showed regressing metastatic disease.   Additional info obtained by brother Marya Amsler, pt main contact -he endorses that his brother has had a slow progression of increasing AMS over the past 3 weeks.  He reports 2 days ago the family wanted him to present to the ED but he refused.  He was found on the floor in his home this morning by his mom.  It is unclear how long he was on the floor.  I have added a total CK to his work-up.  Also added ammonia level.  Brother denies history of significant EtOH use, reports social EtOH only.  Patient will need further evaluation as an inpatient.  At reexam he is a little bit more responsive and that when asked how he is feeling he will open his eyes and say "I feel better", but then quickly drifts off to sleep.   Spoke with Dr. Manuella Ghazi who will see and admit pt.  Final Clinical Impression(s) / ED Diagnoses Final diagnoses:  Altered mental status, unspecified altered mental status type  Hypokalemia  Hypomagnesemia    Rx / DC Orders ED Discharge Orders    None       Landis Martins 03/31/19 1153    Evalee Jefferson, PA-C 03/31/19 1157    Virgel Manifold, MD 04/04/19 1407

## 2019-03-31 NOTE — ED Notes (Signed)
Critical k 2.7, Dr Wilson Singer notified. RN notified

## 2019-03-31 NOTE — ED Notes (Signed)
Dr Manuella Ghazi paged and informed Pt found to be actively seizing in room. Dr Wilson Singer came, total 4 mg of Ativan given, Keppra infusing now. Please advise.

## 2019-03-31 NOTE — H&P (Addendum)
History and Physical    Kevin [name] EVO:[id number] DOB: 27-Jul-1955 DOA: 03/31/2019  PCP: Celene Squibb, MD   Patient coming from: Home  Chief Complaint: Unresponsiveness on the floor  HPI: Kevin [name] is a 64 y.o. male with medical history significant for lung cancer with brain metastasis, dyslipidemia, hypertension, possible seizures, and diabetes who was brought to the ED via EMS after his mother found him on the floor unresponsive.  She was going to take him for further radiation and is noted to have improvement in the size of his metastatic disease recently.  EDP had discussed further with the patient's brother who states that he has been having some worsening confusion and lethargy over the course the last 3 weeks.  He is slightly more alert and responsive in the ED.  No other pertinent history can be obtained.  No reported urinary incontinence or tongue biting or other seizure activity noted.   ED Course: CT of the head with no acute findings aside from stable left-sided vasogenic edema.  He has had brain MRI in the last 30 days with noted regression in metastatic disease.  He is noted to have potassium of 2.7 along with magnesium 1.6 and sodium 134.  He has been given very minimal doses of magnesium and potassium.  Chest x-ray with minimal atelectasis noted bilaterally and troponins stable at 40 and 46 respectively.  EKG with sinus rhythm noted.  Urine analysis with some RBCs noted.  Patient awakens and looks at me when I question him, but cannot give me significant history or answer any my questions.  His Covid antigen testing is negative with full Covid studies still pending.  Glucose within normal limits.  Review of Systems: Cannot be obtained given patient condition.  Past Medical History:  Diagnosis Date  . Cancer [facility])    Lung cancer with brain metastisis  . Hyperlipidemia   . Hypertension   . Restless legs syndrome (RLS)   . Seasonal allergies   . Smoker    Quit 08/04/12    . Ulcer 1988    Past Surgical History:  Procedure Laterality Date  . APPENDECTOMY  1957   hernia repair at the same time  . [place]  2000, 2002, 2007   bilateral  2x's on right and 1x on the left  . TONSILECTOMY, ADENOIDECTOMY, BILATERAL MYRINGOTOMY AND TUBES  1965     reports that he quit smoking about 2 years ago. His smoking use included cigarettes and cigars. He smoked 0.50 packs per day. He has never used smokeless tobacco. He reports current alcohol use of about 22.0 standard drinks of alcohol per week. He reports that he does not use drugs.  No Known Allergies  Family History  Problem Relation Age of Onset  . Lung cancer Father 61  . Colon polyps Brother   . Colon cancer Paternal Aunt   . Colon cancer Cousin   . Esophageal cancer Neg Hx   . Rectal cancer Neg Hx   . Stomach cancer Neg Hx     Prior to Admission medications   Medication Sig Start Date End Date Taking? Authorizing Provider  aspirin 81 MG tablet Take 81 mg by mouth daily.    [provider]  Cholecalciferol (VITAMIN D3) 5000 units TABS Take 1 tablet by mouth daily.     [provider]  ciprofloxacin (CIPRO) 500 MG tablet Take 1 tablet (500 mg total) by mouth 2 (two) times daily. One po bid x 7  days 03/04/19   Milton Ferguson, MD  fluconazole (DIFLUCAN) 100 MG tablet Take 100 mg by mouth daily. 14 day course starting on 02/26/2019 02/26/19   [provider]  furosemide (LASIX) 40 MG tablet Take 1 tablet (40 mg total) by mouth daily. 09/23/12   Lanae Crumbly, PA-C  levETIRAcetam (KEPPRA) 500 MG tablet Take 1 tablet (500 mg total) by mouth 2 (two) times daily. 03/04/19   Milton Ferguson, MD  metFORMIN (GLUCOPHAGE-XR) 500 MG 24 hr tablet Take 500 mg by mouth daily. 01/16/19   [provider]  montelukast (SINGULAIR) 10 MG tablet Take 10 mg by mouth at bedtime.    [provider]  Multiple Vitamin (MULTIVITAMIN) tablet Take 1 tablet by mouth daily.    [provider]  Omega-3 Fatty Acids (FISH OIL) 1000 MG CAPS Take 1,000 mg by mouth daily.     [provider]  potassium chloride SA (KLOR-CON) 20 MEQ tablet Take 20 mEq by mouth daily. 02/04/19   [provider]  pravastatin (PRAVACHOL) 40 MG tablet Take 40 mg by mouth daily.    [provider]  telmisartan (MICARDIS) 80 MG tablet Take 80 mg by mouth daily.  09/18/16   [provider]  zolpidem (AMBIEN) 10 MG tablet Take 10 mg by mouth at bedtime as needed for sleep. 1/2 tab as needed    [provider]  hydrochlorothiazide (HYDRODIURIL) 25 MG tablet Take 1 tablet (25 mg total) by mouth daily. 07/24/12 09/23/12  Susy Frizzle, MD    Physical Exam: Vitals:   03/31/19 1100 03/31/19 1130 03/31/19 1200 03/31/19 1230  BP: 128/89 117/85 128/85 128/87  Pulse: 95 92 100 93  Resp: 19 20 13 19   Temp:      TempSrc:      SpO2: 97% 97% 96% 92%  Weight:      Height:        Constitutional: NAD, alert and arousable to voice, otherwise somnolent Vitals:   03/31/19 1100 03/31/19 1130 03/31/19 1200 03/31/19 1230  BP: 128/89 117/85 128/85 128/87  Pulse: 95 92 100 93  Resp: 19 20 13 19   Temp:      TempSrc:      SpO2: 97% 97% 96% 92%  Weight:      Height:       Eyes: lids and conjunctivae normal ENMT: Mucous membranes are moist.  Neck: normal, supple Respiratory: clear to auscultation bilaterally. Normal respiratory effort. No accessory muscle use.  Currently on room air. Cardiovascular: Regular rate and rhythm, no murmurs. No extremity edema.  Has port to right chest wall. Abdomen: no tenderness, no distention. Bowel sounds positive.  Musculoskeletal:  No joint deformity upper and lower extremities.   Skin: no rashes, lesions, ulcers.  Psychiatric: Cannot be assessed given patient condition.  Labs on Admission: I have personally reviewed following labs and imaging studies  CBC: Recent Labs  Lab 03/31/19 0847  WBC 7.8  NEUTROABS 6.1  HGB  13.0  HCT 38.8*  MCV 96.3  PLT 093   Basic Metabolic Panel: Recent Labs  Lab 03/31/19 0847 03/31/19 1009  NA 134*  --   K 2.7*  --   CL 91*  --   CO2 23  --   GLUCOSE 99  --   BUN 6*  --   CREATININE 0.66  --   CALCIUM 9.0  --   MG  --  1.6*   GFR: Estimated Creatinine Clearance: 103.7 mL/min (by C-G formula based on  SCr of 0.66 mg/dL). Liver Function Tests: Recent Labs  Lab 03/31/19 0847  AST 20  ALT 13  ALKPHOS 62  BILITOT 1.5*  PROT 6.1*  ALBUMIN 3.4*   No results for input(s): LIPASE, AMYLASE in the last 168 hours. No results for input(s): AMMONIA in the last 168 hours. Coagulation Profile: No results for input(s): INR, PROTIME in the last 168 hours. Cardiac Enzymes: No results for input(s): CKTOTAL, CKMB, CKMBINDEX, TROPONINI in the last 168 hours. BNP (last 3 results) No results for input(s): PROBNP in the last 8760 hours. HbA1C: No results for input(s): HGBA1C in the last 72 hours. CBG: Recent Labs  Lab 03/31/19 0949  GLUCAP 90   Lipid Profile: No results for input(s): CHOL, HDL, LDLCALC, TRIG, CHOLHDL, LDLDIRECT in the last 72 hours. Thyroid Function Tests: No results for input(s): TSH, T4TOTAL, FREET4, T3FREE, THYROIDAB in the last 72 hours. Anemia Panel: No results for input(s): VITAMINB12, FOLATE, FERRITIN, TIBC, IRON, RETICCTPCT in the last 72 hours. Urine analysis:    Component Value Date/Time   COLORURINE YELLOW 03/31/2019 [place] 03/31/2019 0847   LABSPEC 1.014 03/31/2019 0847   PHURINE 6.0 03/31/2019 0847   GLUCOSEU NEGATIVE 03/31/2019 0847   HGBUR MODERATE (A) 03/31/2019 0847   BILIRUBINUR NEGATIVE 03/31/2019 0847   KETONESUR 80 (A) 03/31/2019 0847   PROTEINUR 30 (A) 03/31/2019 0847   NITRITE NEGATIVE 03/31/2019 0847   LEUKOCYTESUR NEGATIVE 03/31/2019 0847    Radiological Exams on Admission: CT Head Wo Contrast  Result Date: 03/31/2019 CLINICAL DATA:  Pt's mother called ems for altered mental status. She was  going to pick him up for his radiation tx today due to brain ca and found him "unresponsive". Pt will respond to his name but nothing else.Focal neuro deficit, > 6 hrs, stroke suspected EXAM: CT HEAD WITHOUT CONTRAST TECHNIQUE: Contiguous axial images were obtained from the base of the skull through the vertex without intravenous contrast. COMPARISON:  Head CT 03/04/2019, brain MRI 10/17/2018 FINDINGS: Brain: Patient status post LEFT craniotomy and tumor section. Encephalomalacia and vasogenic edema within the LEFT parietal lobe and LEFT temporal lobe are similar to similar comparison exam. No acute intracranial hemorrhage. No midline shift or mass effect. No CT evidence of cortical infarction. Vascular: No hyperdense vessel or unexpected calcification. Skull: LEFT parietal craniotomy. No acute findings Sinuses/Orbits: No acute finding. Other: IMPRESSION: 1. No CT evidence of acute cortical infarction. 2. Postsurgical encephalomalacia and vasogenic edema in the LEFT temporoparietal lobe unchanged from comparison exam. No acute intracranial findings. Electronically Signed   By: Suzy Bouchard M.D.   On: 03/31/2019 10:28   DG Chest Portable 1 View  Result Date: 03/31/2019 CLINICAL DATA:  Hypoxia EXAM: PORTABLE CHEST 1 VIEW COMPARISON:  03/04/2019 FINDINGS: Right chest wall port catheter is unchanged. Probable minor atelectasis at the lung bases. Stable cardiomediastinal contours. No pleural effusion or pneumothorax. IMPRESSION: Minor bibasilar atelectasis. Electronically Signed   By: Macy Mis M.D.   On: 03/31/2019 09:12    EKG: Independently reviewed. SR 93bpm.  Assessment/Plan Active Problems:   Acute encephalopathy    Acute progressive encephalopathy in the setting of metastatic brain disease -Possibly metabolic in nature versus related to seizure activity -We will place on seizure precautions and start IV Keppra twice daily -EEG to be obtained -Appreciate neurology consultation -Check  ammonia, TSH, B12 levels for metabolic disturbances -SLP/PT/OT evaluation  Severe hypokalemia and hypomagnesemia -Patient noted to be on Lasix and potassium supplementation at home -We will plan to replete  IV and recheck in a.m. -No significant findings noted on EKG and he does not require telemetry  Mild hyponatremia -Likely secondary to HCTZ use which will currently be held -Plan for gentle IV normal saline for now  History of lung cancer with metastatic brain disease -Patient gets radiation and chemotherapy treatments at [facility]  Hypertension -Currently stable and controlled -Hold home blood pressure medications for now -Labetalol as needed severe elevations  Dyslipidemia -Hold statin for now and check CK level  Diabetes-controlled -Hold home Metformin and maintain on SSI   DVT prophylaxis: Lovenox Code Status: Full Family Communication: EDP has discussed with brother Marya Amsler Disposition Plan: Evaluation of encephalopathy with EEG and neuro consultation along with lab work Consults called: Neurology Admission status: Inpatient, MedSurg   Oviya Ammar D Manuella Ghazi DO [facility]  If 7PM-7AM, please contact night-coverage [email or web address]  03/31/2019, 1:21 PM

## 2019-04-01 ENCOUNTER — Inpatient Hospital Stay (HOSPITAL_COMMUNITY): Payer: BC Managed Care – PPO

## 2019-04-01 ENCOUNTER — Inpatient Hospital Stay (HOSPITAL_COMMUNITY)
Admit: 2019-04-01 | Discharge: 2019-04-01 | Disposition: A | Discharge status: Home / Self Care | Payer: BC Managed Care – PPO | Attending: Internal Medicine | Admitting: Internal Medicine

## 2019-04-01 ENCOUNTER — Encounter (HOSPITAL_COMMUNITY): Payer: Self-pay | Admitting: Internal Medicine

## 2019-04-01 DIAGNOSIS — E876 Hypokalemia: Secondary | ICD-10-CM

## 2019-04-01 DIAGNOSIS — G40909 Epilepsy, unspecified, not intractable, without status epilepticus: Secondary | ICD-10-CM

## 2019-04-01 DIAGNOSIS — R4182 Altered mental status, unspecified: Secondary | ICD-10-CM

## 2019-04-01 DIAGNOSIS — R569 Unspecified convulsions: Secondary | ICD-10-CM

## 2019-04-01 DIAGNOSIS — Z515 Encounter for palliative care: Secondary | ICD-10-CM

## 2019-04-01 DIAGNOSIS — J9601 Acute respiratory failure with hypoxia: Secondary | ICD-10-CM | POA: Diagnosis not present

## 2019-04-01 DIAGNOSIS — Z7189 Other specified counseling: Secondary | ICD-10-CM

## 2019-04-01 LAB — BASIC METABOLIC PANEL
Anion gap: 17 — ABNORMAL HIGH (ref 5–15)
BUN: 8 mg/dL (ref 8–23)
CO2: 24 mmol/L (ref 22–32)
Calcium: 8.1 mg/dL — ABNORMAL LOW (ref 8.9–10.3)
Chloride: 97 mmol/L — ABNORMAL LOW (ref 98–111)
Creatinine, Ser: 0.56 mg/dL — ABNORMAL LOW (ref 0.61–1.24)
GFR calc Af Amer: >60 mL/min (ref >60)
GFR calc non Af Amer: >60 mL/min (ref >60)
Glucose, Bld: 111 mg/dL — ABNORMAL HIGH (ref 70–99)
Potassium: 3.3 mmol/L — ABNORMAL LOW (ref 3.5–5.1)
Sodium: 138 mmol/L (ref 135–145)

## 2019-04-01 LAB — CBC
HCT: 38.7 % — ABNORMAL LOW (ref 39.0–52.0)
Hemoglobin: 12.3 g/dL — ABNORMAL LOW (ref 13.0–17.0)
MCH: 31.8 pg (ref 26.0–34.0)
MCHC: 31.8 g/dL (ref 30.0–36.0)
MCV: 100 fL (ref 80.0–100.0)
Platelets: 233 10*3/uL (ref 150–400)
RBC: 3.87 MIL/uL — ABNORMAL LOW (ref 4.22–5.81)
RDW: 15.7 % — ABNORMAL HIGH (ref 11.5–15.5)
WBC: 7.6 10*3/uL (ref 4.0–10.5)
nRBC: 0 % (ref 0.0–0.2)

## 2019-04-01 LAB — GLUCOSE, CAPILLARY
Glucose-Capillary: 112 mg/dL — ABNORMAL HIGH (ref 70–99)
Glucose-Capillary: 92 mg/dL (ref 70–99)
Glucose-Capillary: 98 mg/dL (ref 70–99)
Glucose-Capillary: 118 mg/dL — ABNORMAL HIGH (ref 70–99)

## 2019-04-01 LAB — LACTIC ACID, PLASMA
Lactic Acid, Venous: 1.1 mmol/L (ref 0.5–1.9)
Lactic Acid, Venous: 1.1 mmol/L (ref 0.5–1.9)

## 2019-04-01 LAB — BLOOD GAS, ARTERIAL
Acid-Base Excess: 0.7 mmol/L (ref 0.0–2.0)
Bicarbonate: 23.8 mmol/L (ref 20.0–28.0)
FIO2: 44
O2 Saturation: 88.5 %
Patient temperature: 37
pCO2 arterial: 52.9 mmHg — ABNORMAL HIGH (ref 32.0–48.0)
pH, Arterial: 7.316 — ABNORMAL LOW (ref 7.350–7.450)
pO2, Arterial: 65.3 mmHg — ABNORMAL LOW (ref 83.0–108.0)

## 2019-04-01 LAB — HEMOGLOBIN A1C
Hgb A1c MFr Bld: 4.8 % (ref 4.8–5.6)
Mean Plasma Glucose: 91 mg/dL

## 2019-04-01 LAB — MAGNESIUM: Magnesium: 1.7 mg/dL (ref 1.7–2.4)

## 2019-04-01 MED ORDER — SODIUM CHLORIDE 0.9 % IV SOLN
3.0000 g | Freq: Four times a day (QID) | INTRAVENOUS | Status: DC
  Administered 2019-04-01 – 2019-04-03 (×9): 3 g via INTRAVENOUS
  Filled 2019-04-01 (×2): qty 8
  Filled 2019-04-01: qty 3
  Filled 2019-04-01 (×8): qty 8
  Filled 2019-04-01: qty 3
  Filled 2019-04-01: qty 8

## 2019-04-01 MED ORDER — FUROSEMIDE 10 MG/ML IJ SOLN
INTRAMUSCULAR | Status: AC
  Filled 2019-04-01: qty 2

## 2019-04-01 MED ORDER — FUROSEMIDE 10 MG/ML IJ SOLN
20.0000 mg | Freq: Once | INTRAMUSCULAR | Status: AC
  Administered 2019-04-01: 20 mg via INTRAVENOUS

## 2019-04-01 MED ORDER — SODIUM CHLORIDE 0.9 % IV SOLN
100.0000 mg | Freq: Two times a day (BID) | INTRAVENOUS | Status: DC
  Administered 2019-04-01 – 2019-04-03 (×5): 100 mg via INTRAVENOUS
  Filled 2019-04-01 (×9): qty 10

## 2019-04-01 NOTE — Progress Notes (Signed)
Pharmacy Antibiotic Note  Kevin [name] is a 64 y.o. male admitted on 03/31/2019 with aspiration  pneumonia.  Pharmacy has been consulted for Unasyn dosing.  Plan: Unasyn 3gm IV q6h  F/U cxs and clinical progress Monitor V/S, labs  Height: 6' (182.9 cm) Weight: 181 lb (82.1 kg) IBW/kg (Calculated) : 77.6  Temp (24hrs), Avg:98 F (36.7 C), Min:97.3 F (36.3 C), Max:98.4 F (36.9 C)  Recent Labs  Lab 03/31/19 0847 03/31/19 1549 04/01/19 0349  WBC 7.8 10.1 7.6  CREATININE 0.66 0.84 0.56*    Estimated Creatinine Clearance: 103.7 mL/min (A) (by C-G formula based on SCr of 0.56 mg/dL (L)).    No Known Allergies  Antimicrobials this admission: unasyn 1/5 >>   Dose adjustments this admission: prn  Microbiology results: 1/4 MRSA PCR: negative 1/4  SARS-2 CV is negative  Thank you for allowing pharmacy to be a part of this patient's care.  Isac Sarna, BS Pharm D, [place] Clinical Pharmacist Pager [phone] 04/01/2019 11:09 AM

## 2019-04-01 NOTE — Progress Notes (Signed)
EEG at bedside at this time.

## 2019-04-01 NOTE — Progress Notes (Signed)
Called by RN to NTS suction pt. Pt is unable to clear secretions. Pt suctioned with a moderate amount of this yellow secretions. Small amount of bleeding noted. Pt tolerated fairly well. Increased HR but has returned to baseline. O2 remained 100% entire time

## 2019-04-01 NOTE — Procedures (Addendum)
Patient Name: Kevin [name]  MRN: [id number]  Epilepsy Attending: Lora Havens  Referring Physician/Provider: Dr. Heath Lark Date: 03/31/2018 Duration: 24.10 minutes  Patient history: 64 year old male with metastatic lung cancer with brain mets, left temporoparietal craniotomy with altered mental status.  EEG to evaluate for seizures.  Level of alertness: Lethargic  AEDs during EEG study: Keppra  Technical aspects: This EEG study was done with scalp electrodes positioned according to the 10-20 International system of electrode placement. Electrical activity was acquired at a sampling rate of 500Hz  and reviewed with a high frequency filter of 70Hz  and a low frequency filter of 1Hz . EEG data were recorded continuously and digitally stored.   Description: EEG showed frequent spikes at 1 to 1.5 Hz with overriding fast activity in left temporoparietal region.  EEG also showed continuous generalized and lateralized left hemisphere 2 to 3 Hz slowing admixed with 13 to 15 Hz generalized beta activity.  Hyperventilation for sedation were not performed.  Abnormalities -Spikes, left temporoparietal region Continued slow, generalized and lateralized left hemisphere  IMPRESSION: This study showed evidence of epileptogenicity in left temporoparietal region as well as cortical dysfunction in the left hemisphere consistent with underlying encephalomalacia as well as craniotomy.  Additionally, there was evidence of severe diffuse encephalopathy, nonspecific to etiology.  No seizures were seen throughout the recording.  Dr Roderic Palau was notified immediately.    Donnel Venuto Barbra Sarks

## 2019-04-01 NOTE — Progress Notes (Signed)
OT Cancellation Note  Patient Details Name: Kevin [name] MRN: [id number] DOB: Jul 24, 1955   Cancelled Treatment:    Reason Eval/Treat Not Completed: Medical issues which prohibited therapy. Pt placed on BiPAP this am. Will check back at a later time when pt is appropriate for evaluation.   Guadelupe Sabin, OTR/L  [phone] 04/01/2019, 7:36 AM

## 2019-04-01 NOTE — Progress Notes (Signed)
PROGRESS NOTE    Kevin [name]  UYQ:[id number] DOB: Jul 20, 1955 DOA: 03/31/2019 PCP: Celene Squibb, MD    Brief Narrative:  64 year old male with a history of lung cancer and brain mets, possible seizures, diabetes, hypertension, admitted to the hospital when he was found down at home, unresponsive for an unknown period of time.  Patient was found by his mother.  He was brought to the emergency room where CT head did not show any acute findings other than stable left-sided vasogenic edema.  He was noted to have significant low potassium of 2.7.  He was lethargic and unable to answer questions.  He was admitted for further treatments.  During his hospital course, he became increasingly hypoxic requiring BiPAP therapy.  Remains unresponsive at this time.   Assessment & Plan:   Active Problems:   HLD (hyperlipidemia)   Acute encephalopathy   Altered mental status   Goals of care, counseling/discussion   Palliative care by specialist   DNR (do not resuscitate) discussion   Seizure disorder ([place])   Acute respiratory failure with hypoxia ([place])   1. Acute encephalopathy in the setting of metastatic brain disease.  Unclear etiology.  EEG indicates persistent epileptiform discharges, despite increased dose of Keppra yesterday.  Discussed with neurology and will start on Vimpat.  Continue to monitor. 2. Acute respiratory failure with hypoxia possibly related to aspiration pneumonia.  Respiratory is suctioning thick secretions from respiratory tract.  Patient is currently on BiPAP.  He was down for an unknown period of time.  Start the patient on Unasyn to cover for any aspiration pneumonia especially since he is immunocompromised from chemotherapy.. 3. Hypokalemia and hypomagnesemia.  Replace 4. Mild hyponatremia.  Related to HCTZ use.  On hydration. 5. History of lung cancer with metastatic brain disease.  Currently on radiation and chemotherapy.   6. Hypertension.  Blood pressures currently  stable.  Resume home medications as mental status improves. 7. Seizure disorder.  Keppra dose was increased and he was started on Vimpat.   DVT prophylaxis: Lovenox Code Status: DNR, present on admission Family Communication: Discussed with brother and mother over the phone Disposition Plan: Pending hospital course   Consultants:   Neurology  Procedures:   GLO:VFIE study showed evidence of epileptogenicity in left temporoparietal region as well as cortical dysfunction in the left hemisphere consistent with underlying encephalomalacia as well as craniotomy.  Additionally, there was evidence of severe diffuse encephalopathy, nonspecific to etiology.  No seizures were seen throughout the recording.  Antimicrobials:   Unasyn 1/5>   Subjective: Patient is lethargic, on BiPAP, does not respond to tactile stimuli or verbal stimuli  Objective: Vitals:   04/01/19 1800 04/01/19 1824 04/01/19 1845 04/01/19 1900  BP: (!) 106/92  132/86 124/86  Pulse: (!) 110  (!) 120 (!) 116  Resp: (!) 25  (!) 24 (!) 28  Temp:      TempSrc:      SpO2: 100% 100% 96% 100%  Weight:      Height:        Intake/Output Summary (Last 24 hours) at 04/01/2019 1933 Last data filed at 04/01/2019 1633 Gross per 24 hour  Intake 1399.05 ml  Output 1450 ml  Net -50.95 ml   Filed Weights   03/31/19 0828 03/31/19 1905 04/01/19 0500  Weight: 91 kg 80.5 kg 82.1 kg    Examination:  General exam: Patient is currently on BiPAP, lethargic Respiratory system: Clear to auscultation. Respiratory effort normal. Cardiovascular system: S1 & S2 heard,  tachycardic. No JVD, murmurs, rubs, gallops or clicks. No pedal edema. Gastrointestinal system: Abdomen is nondistended, soft and nontender. No organomegaly or masses felt. Normal bowel sounds heard. Central nervous system: Lethargic and does not follow commands.  Pupils are equal bilaterally. Extremities: Symmetric 5 x 5 power. Skin: No rashes, lesions or  ulcers Psychiatry: Lethargic    Data Reviewed: I have personally reviewed following labs and imaging studies  CBC: Recent Labs  Lab 03/31/19 0847 03/31/19 1549 04/01/19 0349  WBC 7.8 10.1 7.6  NEUTROABS 6.1  --   --   HGB 13.0 13.1 12.3*  HCT 38.8* 40.0 38.7*  MCV 96.3 97.1 100.0  PLT 229 265 811   Basic Metabolic Panel: Recent Labs  Lab 03/31/19 0847 03/31/19 1009 03/31/19 1549 04/01/19 0349  NA 134*  --   --  138  K 2.7*  --   --  3.3*  CL 91*  --   --  97*  CO2 23  --   --  24  GLUCOSE 99  --   --  111*  BUN 6*  --   --  8  CREATININE 0.66  --  0.84 0.56*  CALCIUM 9.0  --   --  8.1*  MG  --  1.6*  --  1.7   GFR: Estimated Creatinine Clearance: 103.7 mL/min (A) (by C-G formula based on SCr of 0.56 mg/dL (L)). Liver Function Tests: Recent Labs  Lab 03/31/19 0847  AST 20  ALT 13  ALKPHOS 62  BILITOT 1.5*  PROT 6.1*  ALBUMIN 3.4*   No results for input(s): LIPASE, AMYLASE in the last 168 hours. Recent Labs  Lab 03/31/19 1549  AMMONIA 24   Coagulation Profile: No results for input(s): INR, PROTIME in the last 168 hours. Cardiac Enzymes: Recent Labs  Lab 03/31/19 1549  CKTOTAL 363   BNP (last 3 results) No results for input(s): PROBNP in the last 8760 hours. HbA1C: Recent Labs    03/31/19 1549  HGBA1C 4.8   CBG: Recent Labs  Lab 03/31/19 0949 03/31/19 2130 04/01/19 0748 04/01/19 1126 04/01/19 1656  GLUCAP 90 93 112* 92 98   Lipid Profile: No results for input(s): CHOL, HDL, LDLCALC, TRIG, CHOLHDL, LDLDIRECT in the last 72 hours. Thyroid Function Tests: Recent Labs    03/31/19 1549  TSH 0.986   Anemia Panel: Recent Labs    03/31/19 1549  VITAMINB12 728   Sepsis Labs: Recent Labs  Lab 04/01/19 1116 04/01/19 1455  LATICACIDVEN 1.1 1.1    Recent Results (from the past 240 hour(s))  SARS CORONAVIRUS 2 (TAT 6-24 HRS) Nasopharyngeal Nasopharyngeal Swab     Status: None   Collection Time: 03/31/19 12:24 PM   Specimen:  Nasopharyngeal Swab  Result Value Ref Range Status   SARS Coronavirus 2 NEGATIVE NEGATIVE Final    Comment: (NOTE) SARS-CoV-2 target nucleic acids are NOT DETECTED. The SARS-CoV-2 RNA is generally detectable in upper and lower respiratory specimens during the acute phase of infection. Negative results do not preclude SARS-CoV-2 infection, do not rule out co-infections with other pathogens, and should not be used as the sole basis for treatment or other patient management decisions. Negative results must be combined with clinical observations, patient history, and epidemiological information. The expected result is Negative. Fact Sheet for Patients: SugarRoll.be Fact Sheet for Healthcare Providers: https://www.woods-mathews.com/ This test is not yet approved or cleared by the [place] FDA and  has been authorized for detection and/or diagnosis of SARS-CoV-2 by FDA under an  Emergency Use Authorization (EUA). This EUA will remain  in effect (meaning this test can be used) for the duration of the COVID-19 declaration under Section 56 4(b)(1) of the Act, 21 U.S.C. section 360bbb-3(b)(1), unless the authorization is terminated or revoked sooner. Performed at [facility], [place] [address], [place], [place] [address]   MRSA PCR Screening     Status: None   Collection Time: 03/31/19  5:06 PM   Specimen: Nasal Mucosa; Nasopharyngeal  Result Value Ref Range Status   MRSA by PCR NEGATIVE NEGATIVE Final    Comment:        The GeneXpert MRSA Assay (FDA approved for NASAL specimens only), is one component of a comprehensive MRSA colonization surveillance program. It is not intended to diagnose MRSA infection nor to guide or monitor treatment for MRSA infections. Performed at [facility], [address] [place], [place] [address]          Radiology Studies: CT Head Wo Contrast  Result Date: 03/31/2019 CLINICAL DATA:  Pt's mother  called ems for altered mental status. She was going to pick him up for his radiation tx today due to brain ca and found him "unresponsive". Pt will respond to his name but nothing else.Focal neuro deficit, > 6 hrs, stroke suspected EXAM: CT HEAD WITHOUT CONTRAST TECHNIQUE: Contiguous axial images were obtained from the base of the skull through the vertex without intravenous contrast. COMPARISON:  Head CT 03/04/2019, brain MRI 10/17/2018 FINDINGS: Brain: Patient status post LEFT craniotomy and tumor section. Encephalomalacia and vasogenic edema within the LEFT parietal lobe and LEFT temporal lobe are similar to similar comparison exam. No acute intracranial hemorrhage. No midline shift or mass effect. No CT evidence of cortical infarction. Vascular: No hyperdense vessel or unexpected calcification. Skull: LEFT parietal craniotomy. No acute findings Sinuses/Orbits: No acute finding. Other: IMPRESSION: 1. No CT evidence of acute cortical infarction. 2. Postsurgical encephalomalacia and vasogenic edema in the LEFT temporoparietal lobe unchanged from comparison exam. No acute intracranial findings. Electronically Signed   By: Suzy Bouchard M.D.   On: 03/31/2019 10:28   DG CHEST PORT 1 VIEW  Result Date: 04/01/2019 CLINICAL DATA:  Hypoxia in the ICU with history of metastatic lung cancer EXAM: PORTABLE CHEST 1 VIEW COMPARISON:  Radiograph 03/31/2019 FINDINGS: Right IJ approach Port-A-Cath tip terminates at the level of the right atrium. Port is currently accessed via [place] needle. There is some increasing vascular congestion with hazy interstitial changes which could reflect developing edema. Cardiomediastinal contours are unchanged from prior with a calcified, tortuous aorta. Degenerative changes are present in the imaged spine and shoulders. No acute osseous or soft tissue abnormality. Telemetry leads overlie the chest. IMPRESSION: 1. Increasing vascular congestion with hazy interstitial changes which could  reflect developing edema. 2. Right IJ approach Port-A-Cath tip terminates at the level of the right atrium. Electronically Signed   By: Lovena Le M.D.   On: 04/01/2019 04:52   DG CHEST PORT 1 VIEW  Result Date: 03/31/2019 CLINICAL DATA:  Chest congestion, lethargy, recent seizure EXAM: PORTABLE CHEST 1 VIEW COMPARISON:  03/31/2019 FINDINGS: Unchanged AP portable examination. No acute appearing airspace opacity. Right chest port catheter. Heart mediastinum are unremarkable. IMPRESSION: Unchanged AP portable examination. No acute appearing airspace opacity. Electronically Signed   By: Eddie Candle M.D.   On: 03/31/2019 16:58   DG Chest Portable 1 View  Result Date: 03/31/2019 CLINICAL DATA:  Hypoxia EXAM: PORTABLE CHEST 1 VIEW COMPARISON:  03/04/2019 FINDINGS: Right chest wall port  catheter is unchanged. Probable minor atelectasis at the lung bases. Stable cardiomediastinal contours. No pleural effusion or pneumothorax. IMPRESSION: Minor bibasilar atelectasis. Electronically Signed   By: Macy Mis M.D.   On: 03/31/2019 09:12   EEG adult  Result Date: 04/01/2019 Lora Havens, MD     04/01/2019  9:55 AM Patient Name: Kevin [name] MRN: [id number] Epilepsy Attending: Lora Havens Referring Physician/Provider: Dr. Heath Lark Date: 03/31/2018 Duration: 24.10 minutes Patient history: 64 year old male with metastatic lung cancer with brain mets, left temporoparietal craniotomy with altered mental status.  EEG to evaluate for seizures. Level of alertness: Lethargic AEDs during EEG study: Keppra Technical aspects: This EEG study was done with scalp electrodes positioned according to the 10-20 International system of electrode placement. Electrical activity was acquired at a sampling rate of 500Hz  and reviewed with a high frequency filter of 70Hz  and a low frequency filter of 1Hz . EEG data were recorded continuously and digitally stored. Description: EEG showed frequent spikes at 1 to 1.5 Hz with  overriding fast activity in left temporoparietal region.  EEG also showed continuous generalized and lateralized left hemisphere 2 to 3 Hz slowing admixed with 13 to 15 Hz generalized beta activity.  Hyperventilation for sedation were not performed. Abnormalities -Spikes, left temporoparietal region Continued slow, generalized and lateralized left hemisphere IMPRESSION: This study showed evidence of epileptogenicity in left temporoparietal region as well as cortical dysfunction in the left hemisphere consistent with underlying encephalomalacia as well as craniotomy.  Additionally, there was evidence of severe diffuse encephalopathy, nonspecific to etiology.  No seizures were seen throughout the recording. Dr Roderic Palau was notified immediately. Priyanka Barbra Sarks        Scheduled Meds: . aspirin  81 mg Oral Daily  . Chlorhexidine Gluconate Cloth  6 each Topical Daily  . enoxaparin (LOVENOX) injection  40 mg Subcutaneous Q24H  . insulin aspart  0-5 Units Subcutaneous QHS  . insulin aspart  0-6 Units Subcutaneous TID WC  . mouth rinse  15 mL Mouth Rinse BID  . montelukast  10 mg Oral QHS  . sodium chloride flush  10-40 mL Intracatheter Q12H   Continuous Infusions: . sodium chloride Stopped (03/31/19 1257)  . ampicillin-sulbactam (UNASYN) IV 200 mL/hr at 04/01/19 1633  . lacosamide (VIMPAT) IV Stopped (04/01/19 1538)  . levETIRAcetam Stopped (04/01/19 1615)     LOS: 1 day    Critical care procedure note Authorized and performed by: Kathie Dike Total critical care time: Approximately 40 minutes Due to high probability of clinically significant, life-threatening deterioration, the patient required my highest level of preparedness to intervene emergently and I personally spent this critical care time directly and personally managing the patient.  The critical care time included obtaining a history, examining the patient, pulse oximetry, ordering and review of studies, arranging urgent treatment  with development of a management plan, evaluation of patient's response to treatment, frequent reassessment, discussions with other providers.  Critical care time was performed to assess and manage the high probability of imminent, life-threatening deterioration that could result in multiorgan failure.  It was exclusive of separate billable procedures and treating other patients and teaching time.  Please see MDM section and the rest of the of note for further information on patient assessment and treatment    Kathie Dike, MD [facility]   If 7PM-7AM, please contact night-coverage [email or web address]  04/01/2019, 7:33 PM

## 2019-04-01 NOTE — Progress Notes (Signed)
Pt placed on BIPAP per MD order.  Pt tolerating well at this time.  RT will continue to monitor.

## 2019-04-01 NOTE — Progress Notes (Signed)
Called to room by RN to NTS patient for thick secretions he is unable to clear.  Pt NT suctioned without issue for moderate amount white thick secretions. Pt tolerated well. HR is increased, O2 sat remains stable around 94-96%.  RT will continue to monitor,

## 2019-04-01 NOTE — Progress Notes (Signed)
Patient oxygenation status continues to decline. Currently on 6L [place] with sats around 88-90 up from 2L at 1900. MD aware. Orders placed and followed.

## 2019-04-01 NOTE — Progress Notes (Signed)
[place] A. Merlene Laughter, MD     [email or web address]          Kevin [name] is an 64 y.o. male.   Assessment/Plan: 1.  Acute encephalopathy: Likely multifactorial including pneumonia and the seizures. Continue with the Keppra and Vimpat.  2.  Metastatic lung cancer with brain metastasis. 3.  Heavy snoring noted suggestive of possible obstructive sleep apnea syndrome   The patient was quite stuporous throughout most of the day.  Essentially unchanged from yesterday. The patient was noted to have frequent epileptiform discharges involving the temparo- parietal region. Given the findings, the patient was given Keppra. The patient also was placed on BiPAP and is on antibiotics. It appears that these maneuvers have helped with the patient's cognition.   GENERAL: Patient is thin and unresponsive  HEENT: He has significant mitrognathtia; neck is supple  ABDOMEN: soft  EXTREMITIES: No edema   BACK: Normal  SKIN: Normal by inspection.    MENTAL STATUS:  He lays in bed with eyes closed. He does open his eyes to light sternal rub.  He speaks in 3 word sentences but conversation is nonsensical. He does not follow commands. No dysarthria is noted.  CRANIAL NERVES: PERRLA; EOMI;  Facial muscle strength is symmetric  MOTOR: Bulk and tone are normal throughout.  The patient withdraws purposefully to deep painful stimuli.  Again, he does not follow commands.  COORDINATION: There is no evidence of parkinsonism.  No tremors or dysmetria noted.  No negative myoclonus or myoclonus.  REFLEXES: Deep tendon reflexes are symmetrical but brisk throughout.  SENSATION: He responds to painful stimuli bilaterally.      EEG Description: EEG showed frequent spikes at 1 to 1.5 Hz with overriding fast activity in left temporoparietal region.  EEG also showed continuous generalized and lateralized left hemisphere 2 to 3 Hz slowing admixed with 13 to 15 Hz generalized beta  activity.  Hyperventilation for sedation were not performed.  Abnormalities -Spikes, left temporoparietal region Continued slow, generalized and lateralized left hemisphere  IMPRESSION: This study showed evidence of epileptogenicity in left temporoparietal region as well as cortical dysfunction in the left hemisphere consistent with underlying encephalomalacia as well as craniotomy.  Additionally, there was evidence of severe diffuse encephalopathy, nonspecific to etiology.  No seizures were seen throughout the recording.  Objective: Vital signs in last 24 hours: Temp:  [97.3 F (36.3 C)-98.8 F (37.1 C)] 98.8 F (37.1 C) (01/05 1751) Pulse Rate:  [103-120] 113 (01/05 1948) Resp:  [18-32] 28 (01/05 1948) BP: (106-149)/(67-97) 124/86 (01/05 1900) SpO2:  [90 %-100 %] 95 % (01/05 1948) FiO2 (%):  [50 %] 50 % (01/05 0745) Weight:  [82.1 kg] 82.1 kg (01/05 0500)  Intake/Output from previous day: 01/04 0701 - 01/05 0700 In: 1724.3 [I.V.:1072.3; IV Piggyback:651.9] Out: 850 [Urine:850] Intake/Output this shift: No intake/output data recorded. Nutritional status:  Diet Order            Diet NPO time specified Except for: Sips with Meds  Diet effective now               Lab Results: Results for orders placed or performed during the hospital encounter of 03/31/19 (from the past 48 hour(s))  Comprehensive metabolic panel     Status: Abnormal   Collection Time: 03/31/19  8:47 AM  Result Value Ref Range   Sodium 134 (L) 135 - 145 mmol/L   Potassium 2.7 (LL) 3.5 - 5.1 mmol/L    Comment: CRITICAL RESULT CALLED TO, READ  BACK BY AND VERIFIED WITH: KUATER,S AT 10:00AM ON 03/31/19 BY FESTERMAN,C    Chloride 91 (L) 98 - 111 mmol/L   CO2 23 22 - 32 mmol/L   Glucose, Bld 99 70 - 99 mg/dL   BUN 6 (L) 8 - 23 mg/dL   Creatinine, Ser 0.66 0.61 - 1.24 mg/dL   Calcium 9.0 8.9 - 10.3 mg/dL   Total Protein 6.1 (L) 6.5 - 8.1 g/dL   Albumin 3.4 (L) 3.5 - 5.0 g/dL   AST 20 15 - 41 U/L    ALT 13 0 - 44 U/L   Alkaline Phosphatase 62 38 - 126 U/L   Total Bilirubin 1.5 (H) 0.3 - 1.2 mg/dL   GFR calc non Af Amer >60 >60 mL/min   GFR calc Af Amer >60 >60 mL/min   Anion gap 20 (H) 5 - 15    Comment: Performed at [facility], [address], [place], [place] [address]  CBC WITH DIFFERENTIAL     Status: Abnormal   Collection Time: 03/31/19  8:47 AM  Result Value Ref Range   WBC 7.8 4.0 - 10.5 K/uL   RBC 4.03 (L) 4.22 - 5.81 MIL/uL   Hemoglobin 13.0 13.0 - 17.0 g/dL   HCT 38.8 (L) 39.0 - 52.0 %   MCV 96.3 80.0 - 100.0 fL   MCH 32.3 26.0 - 34.0 pg   MCHC 33.5 30.0 - 36.0 g/dL   RDW 15.6 (H) 11.5 - 15.5 %   Platelets 229 150 - 400 K/uL   nRBC 0.0 0.0 - 0.2 %   Neutrophils Relative % 79 %   Neutro Abs 6.1 1.7 - 7.7 K/uL   Lymphocytes Relative 6 %   Lymphs Abs 0.5 (L) 0.7 - 4.0 K/uL   Monocytes Relative 15 %   Monocytes Absolute 1.2 (H) 0.1 - 1.0 K/uL   Eosinophils Relative 0 %   Eosinophils Absolute 0.0 0.0 - 0.5 K/uL   Basophils Relative 0 %   Basophils Absolute 0.0 0.0 - 0.1 K/uL   Immature Granulocytes 0 %   Abs Immature Granulocytes 0.02 0.00 - 0.07 K/uL    Comment: Performed at [facility], [address], [place], [place] [address]  Urinalysis, Complete w Microscopic     Status: Abnormal   Collection Time: 03/31/19  8:47 AM  Result Value Ref Range   Color, Urine YELLOW YELLOW   APPearance CLEAR CLEAR   Specific Gravity, Urine 1.014 1.005 - 1.030   pH 6.0 5.0 - 8.0   Glucose, UA NEGATIVE NEGATIVE mg/dL   Hgb urine dipstick MODERATE (A) NEGATIVE   Bilirubin Urine NEGATIVE NEGATIVE   Ketones, ur 80 (A) NEGATIVE mg/dL   Protein, ur 30 (A) NEGATIVE mg/dL   Nitrite NEGATIVE NEGATIVE   Leukocytes,Ua NEGATIVE NEGATIVE   RBC / HPF >50 (H) 0 - 5 RBC/hpf   WBC, UA 0-5 0 - 5 WBC/hpf   Bacteria, UA FEW (A) NONE SEEN   Squamous Epithelial / LPF 0-5 0 - 5   Mucus PRESENT    Hyaline Casts, UA PRESENT    Ca Oxalate Crys, UA PRESENT    Sperm, UA PRESENT     Comment:  Performed at [facility], [address] [place], [place] [address]  Rapid urine drug screen (hospital performed)     Status: None   Collection Time: 03/31/19  8:48 AM  Result Value Ref Range   Opiates NONE DETECTED NONE DETECTED   Cocaine NONE DETECTED NONE DETECTED   Benzodiazepines NONE  DETECTED NONE DETECTED   Amphetamines NONE DETECTED NONE DETECTED   Tetrahydrocannabinol NONE DETECTED NONE DETECTED   Barbiturates NONE DETECTED NONE DETECTED    Comment: (NOTE) DRUG SCREEN FOR MEDICAL PURPOSES ONLY.  IF CONFIRMATION IS NEEDED FOR ANY PURPOSE, NOTIFY LAB WITHIN 5 DAYS. LOWEST DETECTABLE LIMITS FOR URINE DRUG SCREEN Drug Class                     Cutoff (ng/mL) Amphetamine and metabolites    1000 Barbiturate and metabolites    200 Benzodiazepine                 093 Tricyclics and metabolites     300 Opiates and metabolites        300 Cocaine and metabolites        300 THC                            50 Performed at [facility], [address], [place], [place] [address]   EtOH     Status: None   Collection Time: 03/31/19  9:30 AM  Result Value Ref Range   Alcohol, Ethyl (B) <10 <10 mg/dL    Comment: (NOTE) Lowest detectable limit for serum alcohol is 10 mg/dL. For medical purposes only. Performed at [facility], [address] [place], [place] [address]   POC SARS Coronavirus 2 Ag-ED - Nasal Swab (BD Veritor Kit)     Status: None   Collection Time: 03/31/19  9:35 AM  Result Value Ref Range   SARS Coronavirus 2 Ag NEGATIVE NEGATIVE    Comment: (NOTE) SARS-CoV-2 antigen NOT DETECTED.  Negative results are presumptive.  Negative results do not preclude SARS-CoV-2 infection and should not be used as the sole basis for treatment or other patient management decisions, including infection  control decisions, particularly in the presence of clinical signs and  symptoms consistent with COVID-19, or in those who have been in contact with the virus.  Negative results must be  combined with clinical observations, patient history, and epidemiological information. The expected result is Negative. Fact Sheet for Patients: PodPark.tn Fact Sheet for Healthcare Providers: GiftContent.is This test is not yet approved or cleared by the [place] FDA and  has been authorized for detection and/or diagnosis of SARS-CoV-2 by FDA under an Emergency Use Authorization (EUA).  This EUA will remain in effect (meaning this test can be used) for the duration of  the COVID-19 de claration under Section 564(b)(1) of the Act, 21 U.S.C. section 360bbb-3(b)(1), unless the authorization is terminated or revoked sooner.   CBG monitoring, ED     Status: None   Collection Time: 03/31/19  9:49 AM  Result Value Ref Range   Glucose-Capillary 90 70 - 99 mg/dL  Magnesium     Status: Abnormal   Collection Time: 03/31/19 10:09 AM  Result Value Ref Range   Magnesium 1.6 (L) 1.7 - 2.4 mg/dL    Comment: Performed at [facility], [address], [place], [place] [address]  SARS CORONAVIRUS 2 (TAT 6-24 HRS) Nasopharyngeal Nasopharyngeal Swab     Status: None   Collection Time: 03/31/19 12:24 PM   Specimen: Nasopharyngeal Swab  Result Value Ref Range   SARS Coronavirus 2 NEGATIVE NEGATIVE    Comment: (NOTE) SARS-CoV-2 target nucleic acids are NOT DETECTED. The SARS-CoV-2 RNA is generally detectable in upper and lower respiratory specimens during the acute phase of infection. Negative results do not  preclude SARS-CoV-2 infection, do not rule out co-infections with other pathogens, and should not be used as the sole basis for treatment or other patient management decisions. Negative results must be combined with clinical observations, patient history, and epidemiological information. The expected result is Negative. Fact Sheet for Patients: SugarRoll.be Fact Sheet for Healthcare  Providers: https://www.woods-mathews.com/ This test is not yet approved or cleared by the [place] FDA and  has been authorized for detection and/or diagnosis of SARS-CoV-2 by FDA under an Emergency Use Authorization (EUA). This EUA will remain  in effect (meaning this test can be used) for the duration of the COVID-19 declaration under Section 56 4(b)(1) of the Act, 21 U.S.C. section 360bbb-3(b)(1), unless the authorization is terminated or revoked sooner. Performed at [facility], [place] [address] [place], [place] [address]   Ammonia     Status: None   Collection Time: 03/31/19  3:49 PM  Result Value Ref Range   Ammonia 24 9 - 35 umol/L    Comment: Performed at [facility], [address], [place], [place] [address]  CK     Status: None   Collection Time: 03/31/19  3:49 PM  Result Value Ref Range   Total CK 363 49 - 397 U/L    Comment: Performed at [facility], [address] [place], [place] [address]  HIV Antibody (routine testing w rflx)     Status: None   Collection Time: 03/31/19  3:49 PM  Result Value Ref Range   HIV Screen 4th Generation wRfx NON REACTIVE NON REACTIVE    Comment: Performed at [facility], [place] [address] [place], [place] [address]  Hemoglobin A1c     Status: None   Collection Time: 03/31/19  3:49 PM  Result Value Ref Range   Hgb A1c MFr Bld 4.8 4.8 - 5.6 %    Comment: (NOTE)         Prediabetes: 5.7 - 6.4         Diabetes: >6.4         Glycemic control for adults with diabetes: <7.0    Mean Plasma Glucose 91 mg/dL    Comment: (NOTE) Performed At: [facility] [place], [place] [id number] Rush Farmer MD [id number]   CBC     Status: Abnormal   Collection Time: 03/31/19  3:49 PM  Result Value Ref Range   WBC 10.1 4.0 - 10.5 K/uL   RBC 4.12 (L) 4.22 - 5.81 MIL/uL   Hemoglobin 13.1 13.0 - 17.0 g/dL   HCT 40.0 39.0 - 52.0 %   MCV 97.1 80.0 - 100.0 fL   MCH 31.8 26.0 - 34.0 pg   MCHC 32.8  30.0 - 36.0 g/dL   RDW 15.6 (H) 11.5 - 15.5 %   Platelets 265 150 - 400 K/uL   nRBC 0.0 0.0 - 0.2 %    Comment: Performed at [facility], [address] [place], [place] [address]  Creatinine, serum     Status: None   Collection Time: 03/31/19  3:49 PM  Result Value Ref Range   Creatinine, Ser 0.84 0.61 - 1.24 mg/dL   GFR calc non Af Amer >60 >60 mL/min   GFR calc Af Amer >60 >60 mL/min    Comment: Performed at [facility], [address] [place], [place] [address]  TSH     Status: None   Collection Time: 03/31/19  3:49 PM  Result Value Ref Range   TSH 0.986 0.350 -  4.500 uIU/mL    Comment: Performed by a 3rd Generation assay with a functional sensitivity of <=0.01 uIU/mL. Performed at [facility], [address], [place], [place] [address]   Vitamin B12     Status: None   Collection Time: 03/31/19  3:49 PM  Result Value Ref Range   Vitamin B-12 728 180 - 914 pg/mL    Comment: (NOTE) This assay is not validated for testing neonatal or myeloproliferative syndrome specimens for Vitamin B12 levels. Performed at [facility], [address], [place], [place] [address]   MRSA PCR Screening     Status: None   Collection Time: 03/31/19  5:06 PM   Specimen: Nasal Mucosa; Nasopharyngeal  Result Value Ref Range   MRSA by PCR NEGATIVE NEGATIVE    Comment:        The GeneXpert MRSA Assay (FDA approved for NASAL specimens only), is one component of a comprehensive MRSA colonization surveillance program. It is not intended to diagnose MRSA infection nor to guide or monitor treatment for MRSA infections. Performed at [facility], [address] [place], [place] [address]   Glucose, capillary     Status: None   Collection Time: 03/31/19  9:30 PM  Result Value Ref Range   Glucose-Capillary 93 70 - 99 mg/dL  Magnesium     Status: None   Collection Time: 04/01/19  3:49 AM  Result Value Ref Range   Magnesium 1.7 1.7 - 2.4 mg/dL    Comment: Performed at [facility], [address], [place], [place] [address]  Basic metabolic panel     Status: Abnormal   Collection Time: 04/01/19  3:49 AM  Result Value Ref Range   Sodium 138 135 - 145 mmol/L   Potassium 3.3 (L) 3.5 - 5.1 mmol/L    Comment: DELTA CHECK NOTED   Chloride 97 (L) 98 - 111 mmol/L   CO2 24 22 - 32 mmol/L   Glucose, Bld 111 (H) 70 - 99 mg/dL   BUN 8 8 - 23 mg/dL   Creatinine, Ser 0.56 (L) 0.61 - 1.24 mg/dL   Calcium 8.1 (L) 8.9 - 10.3 mg/dL   GFR calc non Af Amer >60 >60 mL/min   GFR calc Af Amer >60 >60 mL/min   Anion gap 17 (H) 5 - 15    Comment: Performed at [facility], [address], [place], [place] [address]  CBC     Status: Abnormal   Collection Time: 04/01/19  3:49 AM  Result Value Ref Range   WBC 7.6 4.0 - 10.5 K/uL   RBC 3.87 (L) 4.22 - 5.81 MIL/uL   Hemoglobin 12.3 (L) 13.0 - 17.0 g/dL   HCT 38.7 (L) 39.0 - 52.0 %   MCV 100.0 80.0 - 100.0 fL   MCH 31.8 26.0 - 34.0 pg   MCHC 31.8 30.0 - 36.0 g/dL   RDW 15.7 (H) 11.5 - 15.5 %   Platelets 233 150 - 400 K/uL   nRBC 0.0 0.0 - 0.2 %    Comment: Performed at [facility], [address], [place], [place] [address]  Blood gas, arterial     Status: Abnormal   Collection Time: 04/01/19  4:40 AM  Result Value Ref Range   FIO2 44.00    pH, Arterial 7.316 (L) 7.350 - 7.450   pCO2 arterial 52.9 (H) 32.0 - 48.0 mmHg   pO2, Arterial 65.3 (L) 83.0 - 108.0 mmHg   Bicarbonate 23.8 20.0 - 28.0 mmol/L   Acid-Base Excess 0.7  0.0 - 2.0 mmol/L   O2 Saturation 88.5 %   Patient temperature 37.0    Allens test (pass/fail) PASS PASS    Comment: Performed at [facility], [address], [place], [place] [address]  Glucose, capillary     Status: Abnormal   Collection Time: 04/01/19  7:48 AM  Result Value Ref Range   Glucose-Capillary 112 (H) 70 - 99 mg/dL  Lactic acid, plasma     Status: None   Collection Time: 04/01/19 11:16 AM  Result Value Ref Range   Lactic Acid, Venous 1.1 0.5 - 1.9 mmol/L    Comment: Performed at [facility], [address],  [place], [place] [address]  Glucose, capillary     Status: None   Collection Time: 04/01/19 11:26 AM  Result Value Ref Range   Glucose-Capillary 92 70 - 99 mg/dL  Lactic acid, plasma     Status: None   Collection Time: 04/01/19  2:55 PM  Result Value Ref Range   Lactic Acid, Venous 1.1 0.5 - 1.9 mmol/L    Comment: Performed at [facility], [address], [place], [place] [address]  Glucose, capillary     Status: None   Collection Time: 04/01/19  4:56 PM  Result Value Ref Range   Glucose-Capillary 98 70 - 99 mg/dL    Lipid Panel No results for input(s): CHOL, TRIG, HDL, CHOLHDL, VLDL, LDLCALC in the last 72 hours.  Studies/Results:   Medications:  Scheduled Meds: . aspirin  81 mg Oral Daily  . Chlorhexidine Gluconate Cloth  6 each Topical Daily  . enoxaparin (LOVENOX) injection  40 mg Subcutaneous Q24H  . insulin aspart  0-5 Units Subcutaneous QHS  . insulin aspart  0-6 Units Subcutaneous TID WC  . mouth rinse  15 mL Mouth Rinse BID  . montelukast  10 mg Oral QHS  . sodium chloride flush  10-40 mL Intracatheter Q12H   Continuous Infusions: . sodium chloride Stopped (03/31/19 1257)  . ampicillin-sulbactam (UNASYN) IV 200 mL/hr at 04/01/19 1633  . lacosamide (VIMPAT) IV Stopped (04/01/19 1538)  . levETIRAcetam Stopped (04/01/19 1615)   PRN Meds:.sodium chloride, acetaminophen **OR** acetaminophen, labetalol, LORazepam, ondansetron **OR** ondansetron (ZOFRAN) IV, sodium chloride flush     LOS: 1 day   Doll Frazee A. Merlene Laughter, M.D.  Diplomate, Tax adviser of Psychiatry and Neurology ( Neurology).

## 2019-04-01 NOTE — Consult Note (Signed)
Consultation Note Date: 04/01/2019   Patient Name: Kevin [name]  DOB: 02/24/1956  MRN: [id number]  Age / Sex: 64 y.o., male  PCP: Kevin Squibb, MD Referring Physician: Kathie Dike, MD  Reason for Consultation: Establishing goals of care and Psychosocial/spiritual support  HPI/Patient Profile: 64 y.o. male  with past medical history of lung cancer with metastatic burden to brain last chemo in November, purportedly to have radiation therapy beginning in January,HTN/HLD, restless leg, former smoker quit 2014, ulcer admitted on 03/31/2019 with acute progressive encephalopathy in the setting of metastatic brain disease, severe hypokalemia and hypomagnesium.   Clinical Assessment and Goals of Care: I have reviewed medical records including EPIC notes, labs and imaging, received report from attending and bedside nursing, assessed the patient.    Kevin [name] is resting quietly in bed.  He appears [place] acutely/chronically ill and frail.  He does not respond to voice or touch.  There is no family at bedside at this time.  Call to brother Kevin [name] at [phone] to discuss diagnosis prognosis, [place], EOL wishes, disposition and options.  Kevin [name] request call to mother, Kevin [name] [phone], to keep her updated..     I introduced Palliative Medicine as specialized medical care for people living with serious illness. It focuses on providing relief from the symptoms and stress of a serious illness. The goal is to improve quality of life for both the patient and the family.  We discussed a brief life review of the patient.  Kevin [name] states that Kevin [name] is not married, has no children.  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  Kevin [name] states that Kevin [name]'s employer encouraged him to take leave, care for  himself.   Kevin [name] lives alone, next door to mother who is 7.   As far as functional and nutritional status, Kevin [name] have been working and relatively independent until mid December.  We talked about the chronic illness pathway, and particular about declines near end-of-life for those with cancer  We discussed current illness and what it means in the larger context of on-going co-morbidities.  Natural disease trajectory and expectations at EOL were discussed.    I attempted to elicit values and goals of care important to the patient.  Kevin [name] states that his brother are not always valued quality over quantity.   The difference between aggressive medical intervention and comfort care was considered in light of the patient's goals of care.  We talked about time for outcomes, but if no improvement in 24 to 48 hours, comfort and dignity at end-of-life, residential hospice would be a loving choice.  Advanced directives, concepts specific to code status were considered and discussed.  Kevin [name] states that Kevin [name] always endorsed DNR  Hospice facility services were explained and offered.  Questions and concerns were addressed.  The family was encouraged to call with questions or concerns.    HCPOA  NEXT OF KIN -brother Kevin [name] states that he is Kevin [name].  Mother, Kevin [name], is 27 years old.  Kevin [name] is unmarried, has no children.    SUMMARY OF RECOMMENDATIONS   24 to 48 hours for outcomes  Code Status/Advance Care Planning:  DNR  Symptom Management:   Per hospitalist, no additional needs at this time.  Palliative Prophylaxis:   Oral Care and Turn Reposition  Additional Recommendations (Limitations, Scope, Preferences):  At this point continue to treat the treatable but no CPR, no intubation  Psycho-social/Spiritual:   Desire for further Chaplaincy support:no  Additional Recommendations: Caregiving  Support/Resources and Education on Hospice  Prognosis:   Unable to determine,  based on outcomes.  Guarded  Discharge Planning: To be determined, based on outcomes.      Primary Diagnoses: Present on Admission: . Acute encephalopathy   I have reviewed the medical record, interviewed the patient and family, and examined the patient. The following aspects are pertinent.  Past Medical History:  Diagnosis Date  . Cancer [facility])    Lung cancer with brain metastisis  . Hyperlipidemia   . Hypertension   . Restless legs syndrome (RLS)   . Seasonal allergies   . Smoker    Quit 08/04/12  . Ulcer 1988   Social History   Socioeconomic History  . Marital status: Single    Spouse name: Not on file  . Number of children: Not on file  . Years of education: Not on file  . Highest education level: Not on file  Occupational History  . Not on file  Tobacco Use  . Smoking status: Former Smoker    Packs/day: 0.50    Types: Cigarettes, Cigars    Quit date: 07/25/2016    Years since quitting: 2.6  . Smokeless tobacco: Never Used  Substance and Sexual Activity  . Alcohol use: Yes    Alcohol/week: 22.0 standard drinks    Types: 12 Cans of beer, 10 Shots of liquor per week  . Drug use: No  . Sexual activity: Not on file  Other Topics Concern  . Not on file  Social History Narrative  . Not on file   Social Determinants of Health   Financial Resource Strain:   . Difficulty of Paying Living Expenses: Not on file  Food Insecurity:   . Worried About Charity fundraiser in the Last Year: Not on file  . Ran Out of Food in the Last Year: Not on file  Transportation Needs:   . Lack of Transportation (Medical): Not on file  . Lack of Transportation (Non-Medical): Not on file  Physical Activity:   . Days of Exercise per Week: Not on file  . Minutes of Exercise per Session: Not on file  Stress:   . Feeling of Stress : Not on file  Social Connections:   . Frequency of Communication with Friends and Family: Not on file  . Frequency of Social Gatherings with Friends and  Family: Not on file  . Attends Religious Services: Not on file  . Active Member of Clubs or Organizations: Not on file  . Attends Archivist Meetings: Not on file  . Marital Status: Not on file   Family History  Problem Relation Age of Onset  . Lung cancer Father 57  . Colon polyps Brother   . Colon cancer Paternal Aunt   . Colon cancer Cousin   . Esophageal cancer Neg Hx   . Rectal cancer Neg Hx   .  Stomach cancer Neg Hx    Scheduled Meds: . aspirin  81 mg Oral Daily  . Chlorhexidine Gluconate Cloth  6 each Topical Daily  . enoxaparin (LOVENOX) injection  40 mg Subcutaneous Q24H  . insulin aspart  0-5 Units Subcutaneous QHS  . insulin aspart  0-6 Units Subcutaneous TID WC  . mouth rinse  15 mL Mouth Rinse BID  . montelukast  10 mg Oral QHS  . sodium chloride flush  10-40 mL Intracatheter Q12H   Continuous Infusions: . sodium chloride Stopped (03/31/19 1257)  . levETIRAcetam Stopped (04/01/19 0420)   PRN Meds:.sodium chloride, acetaminophen **OR** acetaminophen, labetalol, LORazepam, ondansetron **OR** ondansetron (ZOFRAN) IV, sodium chloride flush Medications Prior to Admission:  Prior to Admission medications   Medication Sig Start Date End Date Taking? Authorizing Provider  aspirin 81 MG tablet Take 81 mg by mouth daily.    [provider]  Cholecalciferol (VITAMIN D3) 5000 units TABS Take 1 tablet by mouth daily.     [provider]  ciprofloxacin (CIPRO) 500 MG tablet Take 1 tablet (500 mg total) by mouth 2 (two) times daily. One po bid x 7 days 03/04/19   Milton Ferguson, MD  fluconazole (DIFLUCAN) 100 MG tablet Take 100 mg by mouth daily. 14 day course starting on 02/26/2019 02/26/19   [provider]  furosemide (LASIX) 40 MG tablet Take 1 tablet (40 mg total) by mouth daily. 09/23/12   Lanae Crumbly, PA-C  levETIRAcetam (KEPPRA) 500 MG tablet Take 1 tablet (500 mg total) by mouth 2 (two) times daily. 03/04/19   Milton Ferguson, MD    metFORMIN (GLUCOPHAGE-XR) 500 MG 24 hr tablet Take 500 mg by mouth daily. 01/16/19   [provider]  montelukast (SINGULAIR) 10 MG tablet Take 10 mg by mouth at bedtime.    [provider]  Multiple Vitamin (MULTIVITAMIN) tablet Take 1 tablet by mouth daily.    [provider]  Omega-3 Fatty Acids (FISH OIL) 1000 MG CAPS Take 1,000 mg by mouth daily.     [provider]  ondansetron (ZOFRAN) 8 MG tablet Take 8 mg by mouth every 8 (eight) hours as needed for nausea or vomiting.  03/26/19   [provider]  potassium chloride SA (KLOR-CON) 20 MEQ tablet Take 20 mEq by mouth daily. 02/04/19   [provider]  pravastatin (PRAVACHOL) 40 MG tablet Take 40 mg by mouth daily.    [provider]  telmisartan (MICARDIS) 80 MG tablet Take 80 mg by mouth daily.  09/18/16   [provider]  zolpidem (AMBIEN) 10 MG tablet Take 10 mg by mouth at bedtime as needed for sleep. 1/2 tab as needed    [provider]  hydrochlorothiazide (HYDRODIURIL) 25 MG tablet Take 1 tablet (25 mg total) by mouth daily. 07/24/12 09/23/12  Susy Frizzle, MD   No Known Allergies Review of Systems  Unable to perform ROS: Acuity of condition    Physical Exam Vitals and nursing note reviewed.  Constitutional:      General: He is not in acute distress.    Appearance: He is ill-appearing.     Comments: Does not wake to voice or touch  Pulmonary:     Comments: BiPAP Abdominal:     General: Abdomen is flat. There is no distension.  Musculoskeletal:        General: No swelling.     Comments: Thin  Skin:    General: Skin is warm and dry.  Coloration: Skin is pale.  Neurological:     Comments: Lethargic     Vital Signs: BP 126/85   Pulse (!) 109   Temp 98.4 F (36.9 C) (Axillary)   Resp (!) 22   Ht 6' (1.829 m)   Wt 82.1 kg   SpO2 100%   BMI 24.55 kg/m  Pain Scale: CPOT       SpO2: SpO2: 100 % O2 Device:SpO2: 100 % O2  Flow Rate: .O2 Flow Rate (L/min): 3 L/min  IO: Intake/output summary:   Intake/Output Summary (Last 24 hours) at 04/01/2019 0919 Last data filed at 04/01/2019 0719 Gross per 24 hour  Intake 1815.8 ml  Output 850 ml  Net 965.8 ml    LBM: Last BM Date: (unknown pt is unresponsive) Baseline Weight: Weight: 91 kg Most recent weight: Weight: 82.1 kg     Palliative Assessment/Data:   Flowsheet Rows     Most Recent Value  Intake Tab  Referral Department  Hospitalist  Unit at Time of Referral  Intermediate Care Unit  Palliative Care Primary Diagnosis  Other (Comment) [GI]  Date Notified  03/31/19  Palliative Care Type  New Palliative care  Reason for referral  Clarify Goals of Care  Date of Admission  03/31/19  Date first seen by Palliative Care  04/01/19  # of days Palliative referral response time  1 Day(s)  # of days IP prior to Palliative referral  0  Clinical Assessment  Palliative Performance Scale Score  20%  Pain Max last 24 hours  Not able to report  Pain Min Last 24 hours  Not able to report  Dyspnea Max Last 24 Hours  Not able to report  Dyspnea Min Last 24 hours  Not able to report  Psychosocial & Spiritual Assessment  Palliative Care Outcomes      Time In: 1040 Time Out: 1150 Time Total: 70 minutes  Greater than 50%  of this time was spent counseling and coordinating care related to the above assessment and plan.  Signed by: Drue Novel, NP   Please contact Palliative Medicine Team phone at [phone] for questions and concerns.  For individual provider: See Shea Evans

## 2019-04-01 NOTE — Progress Notes (Signed)
Patient has very thick secretions in his throat that he is unable to expectorate. MD aware. Orders placed.

## 2019-04-01 NOTE — Progress Notes (Signed)
SLP Cancellation Note  Patient Details Name: Kevin [name] MRN: [id number] DOB: 1955/11/11   Cancelled treatment:       Reason Eval/Treat Not Completed: Medical issues which prohibited therapy;Patient not medically ready;Patient's level of consciousness; SLP discussed with RN and Pt currently inappropriate for BSE. SLP will check back tomorrow.  Thank you,  Genene Churn, [place]    [place] 04/01/2019, 2:58 PM

## 2019-04-01 NOTE — Progress Notes (Signed)
PT Cancellation Note  Patient Details Name: Kevin [name] MRN: [id number] DOB: 08-15-1955   Cancelled Treatment:    Reason Eval/Treat Not Completed: Medical issues which prohibited therapy RN stated patient was not appropriate for physical therapy today and he is not able to be worked with at this time.   10:36 AM, 04/01/19 Mearl Latin PT, DPT Physical Therapist at [facility]

## 2019-04-01 NOTE — Progress Notes (Signed)
Pt off BIPAP and comfortable on 4l nasal cannula at this time.  RT will continue to monitor.

## 2019-04-01 NOTE — Progress Notes (Signed)
EEG completed, results pending Notified Neuro. STAT read

## 2019-04-01 NOTE — Progress Notes (Signed)
Patient has not urinated this shift, no output charted on dayshift. Bladder scan shows over 200 mL in bladder. MD aware.

## 2019-04-02 DIAGNOSIS — Z7189 Other specified counseling: Secondary | ICD-10-CM

## 2019-04-02 DIAGNOSIS — J9601 Acute respiratory failure with hypoxia: Secondary | ICD-10-CM

## 2019-04-02 DIAGNOSIS — G9340 Encephalopathy, unspecified: Secondary | ICD-10-CM

## 2019-04-02 LAB — BASIC METABOLIC PANEL
Anion gap: 15 (ref 5–15)
BUN: 16 mg/dL (ref 8–23)
CO2: 28 mmol/L (ref 22–32)
Calcium: 8.5 mg/dL — ABNORMAL LOW (ref 8.9–10.3)
Chloride: 99 mmol/L (ref 98–111)
Creatinine, Ser: 0.59 mg/dL — ABNORMAL LOW (ref 0.61–1.24)
GFR calc Af Amer: >60 mL/min (ref >60)
GFR calc non Af Amer: >60 mL/min (ref >60)
Glucose, Bld: 110 mg/dL — ABNORMAL HIGH (ref 70–99)
Potassium: 2.9 mmol/L — ABNORMAL LOW (ref 3.5–5.1)
Sodium: 142 mmol/L (ref 135–145)

## 2019-04-02 LAB — CBC
HCT: 35.5 % — ABNORMAL LOW (ref 39.0–52.0)
Hemoglobin: 11.3 g/dL — ABNORMAL LOW (ref 13.0–17.0)
MCH: 31.6 pg (ref 26.0–34.0)
MCHC: 31.8 g/dL (ref 30.0–36.0)
MCV: 99.2 fL (ref 80.0–100.0)
Platelets: 220 10*3/uL (ref 150–400)
RBC: 3.58 MIL/uL — ABNORMAL LOW (ref 4.22–5.81)
RDW: 15.7 % — ABNORMAL HIGH (ref 11.5–15.5)
WBC: 6.6 10*3/uL (ref 4.0–10.5)
nRBC: 0 % (ref 0.0–0.2)

## 2019-04-02 LAB — GLUCOSE, CAPILLARY
Glucose-Capillary: 102 mg/dL — ABNORMAL HIGH (ref 70–99)
Glucose-Capillary: 83 mg/dL (ref 70–99)
Glucose-Capillary: 95 mg/dL (ref 70–99)
Glucose-Capillary: 92 mg/dL (ref 70–99)

## 2019-04-02 MED ORDER — CHLORHEXIDINE GLUCONATE 0.12 % MT SOLN
15.0000 mL | Freq: Two times a day (BID) | OROMUCOSAL | Status: DC
  Administered 2019-04-02 – 2019-04-03 (×3): 15 mL via OROMUCOSAL

## 2019-04-02 MED ORDER — POTASSIUM CHLORIDE 10 MEQ/100ML IV SOLN
10.0000 meq | INTRAVENOUS | Status: AC
  Administered 2019-04-02 (×4): 10 meq via INTRAVENOUS
  Filled 2019-04-02: qty 100

## 2019-04-02 MED ORDER — MAGNESIUM SULFATE 2 GM/50ML IV SOLN
2.0000 g | Freq: Once | INTRAVENOUS | Status: AC
  Administered 2019-04-02: 2 g via INTRAVENOUS
  Filled 2019-04-02: qty 50

## 2019-04-02 NOTE — Progress Notes (Signed)
Pt on RA. Refuses to wear O2. Bipap on standby. O2 saturation WNL

## 2019-04-02 NOTE — Progress Notes (Signed)
PROGRESS NOTE    FANNIE ALOMAR  WNI:[id number] DOB: [birth year] DOA: 03/31/2019 PCP: Celene Squibb, MD    Brief Narrative:  64 year old male with a history of lung cancer and brain mets, possible seizures, diabetes, hypertension, admitted to the hospital when he was found down at home, unresponsive for an unknown period of time.  Patient was found by his mother.  He was brought to the emergency room where CT head did not show any acute findings other than stable left-sided vasogenic edema.  He was noted to have significant low potassium of 2.7.  He was lethargic and unable to answer questions.  He was admitted for further treatments.  During his hospital course, he became increasingly hypoxic requiring BiPAP therapy.  Remains unresponsive at this time.   Assessment & Plan:   Active Problems:   HLD (hyperlipidemia)   Acute encephalopathy   Altered mental status   Goals of care, counseling/discussion   Palliative care by specialist   DNR (do not resuscitate) discussion   Seizure disorder ([place])   Acute respiratory failure with hypoxia ([place])   Encounter for hospice care discussion   1. Acute encephalopathy in the setting of metastatic brain disease.  Unclear etiology.  EEG indicated persistent epileptiform discharges, despite increased dose of Keppra.  Discussed with neurology and he was started on Vimpat.  Overall mental status appears to be improving.  He is awake now, but speech is difficult to comprehend any appears disoriented.  Continue to monitor for progress. 2. Acute respiratory failure with hypoxia possibly related to aspiration pneumonia.  Respiratory was suctioning thick secretions from respiratory tract.  He was on BiPAP on 1/5, but has since been weaned off.  Prior to admission, he was found laying on the ground for an unknown period of time.  He has been started on Unasyn to cover for any aspiration pneumonia especially since he is immunocompromised from  chemotherapy.. 3. Hypokalemia and hypomagnesemia.  Replace 4. Mild hyponatremia.  Related to HCTZ use.  On hydration. 5. History of lung cancer with metastatic brain disease.  Currently on radiation and chemotherapy.   6. Hypertension.  Blood pressures currently stable.  Resume home medications as mental status improves. 7. Seizure disorder.  Keppra dose was increased and he was started on Vimpat. 8. Goals of care.  Family confirms that patient is DNR.  Palliative care following to assist in conversations around further goals.  At this point, family wishes to continue current treatments to monitor for any further improvements.  If patient does not make any meaningful improvements in terms of his mental status and speech, they would consider residential hospice.   DVT prophylaxis: Lovenox Code Status: DNR, present on admission Family Communication: Discussed with brother over the phone Disposition Plan: If patient fails to make meaningful improvement, family is considering transitioning to hospice.  Mother reports that patient would not want to discharge to skilled nursing facility.   Consultants:   Neurology  Palliative care  Procedures:   [facility]:WEXH study showed evidence of epileptogenicity in left temporoparietal region as well as cortical dysfunction in the left hemisphere consistent with underlying encephalomalacia as well as craniotomy.  Additionally, there was evidence of severe diffuse encephalopathy, nonspecific to etiology.  No seizures were seen throughout the recording.  Antimicrobials:   Unasyn 1/5>   Subjective: Patient is awake.  Speech is difficult to comprehend.  He is off BiPAP now.  Objective: Vitals:   04/02/19 1119 04/02/19 1500 04/02/19 1614 04/02/19 1700  BP:  Marland Kitchen)  137/97  (!) 130/91  Pulse: (!) 102 98 (!) 101 99  Resp: (!) 24 (!) 25 (!) 23 (!) 24  Temp: 97.9 F (36.6 C)  98.2 F (36.8 C)   TempSrc: Axillary  Axillary   SpO2: 96% 96% 96% 93%  Weight:       Height:        Intake/Output Summary (Last 24 hours) at 04/02/2019 1816 Last data filed at 04/02/2019 1700 Gross per 24 hour  Intake 949.86 ml  Output --  Net 949.86 ml   Filed Weights   03/31/19 1905 04/01/19 0500 04/02/19 0500  Weight: 80.5 kg 82.1 kg 83.5 kg    Examination:  General exam: Awake, laying in bed, appears disoriented Respiratory system: Clear to auscultation. Respiratory effort normal. Cardiovascular system:RRR. No murmurs, rubs, gallops. Gastrointestinal system: Abdomen is nondistended, soft and nontender. No organomegaly or masses felt. Normal bowel sounds heard. Central nervous system: Appears to be significantly dysarthric.  Moving all extremities spontaneously.  Does not follow commands. Extremities: No C/C/E, +pedal pulses Skin: No rashes, lesions or ulcers Psychiatry: Speech is incoherent   Data Reviewed: I have personally reviewed following labs and imaging studies  CBC: Recent Labs  Lab 03/31/19 0847 03/31/19 1549 04/01/19 0349 04/02/19 1250  WBC 7.8 10.1 7.6 6.6  NEUTROABS 6.1  --   --   --   HGB 13.0 13.1 12.3* 11.3*  HCT 38.8* 40.0 38.7* 35.5*  MCV 96.3 97.1 100.0 99.2  PLT 229 265 233 025   Basic Metabolic Panel: Recent Labs  Lab 03/31/19 0847 03/31/19 1009 03/31/19 1549 04/01/19 0349 04/02/19 1050  NA 134*  --   --  138 142  K 2.7*  --   --  3.3* 2.9*  CL 91*  --   --  97* 99  CO2 23  --   --  24 28  GLUCOSE 99  --   --  111* 110*  BUN 6*  --   --  8 16  CREATININE 0.66  --  0.84 0.56* 0.59*  CALCIUM 9.0  --   --  8.1* 8.5*  MG  --  1.6*  --  1.7  --    GFR: Estimated Creatinine Clearance: 103.7 mL/min (A) (by C-G formula based on SCr of 0.59 mg/dL (L)). Liver Function Tests: Recent Labs  Lab 03/31/19 0847  AST 20  ALT 13  ALKPHOS 62  BILITOT 1.5*  PROT 6.1*  ALBUMIN 3.4*   No results for input(s): LIPASE, AMYLASE in the last 168 hours. Recent Labs  Lab 03/31/19 1549  AMMONIA 24   Coagulation Profile: No  results for input(s): INR, PROTIME in the last 168 hours. Cardiac Enzymes: Recent Labs  Lab 03/31/19 1549  CKTOTAL 363   BNP (last 3 results) No results for input(s): PROBNP in the last 8760 hours. HbA1C: Recent Labs    03/31/19 1549  HGBA1C 4.8   CBG: Recent Labs  Lab 04/01/19 1656 04/01/19 2146 04/02/19 0732 04/02/19 1120 04/02/19 1616  GLUCAP 98 118* 102* 83 95   Lipid Profile: No results for input(s): CHOL, HDL, LDLCALC, TRIG, CHOLHDL, LDLDIRECT in the last 72 hours. Thyroid Function Tests: Recent Labs    03/31/19 1549  TSH 0.986   Anemia Panel: Recent Labs    03/31/19 1549  VITAMINB12 728   Sepsis Labs: Recent Labs  Lab 04/01/19 1116 04/01/19 1455  LATICACIDVEN 1.1 1.1    Recent Results (from the past 240 hour(s))  SARS CORONAVIRUS 2 (TAT 6-24 HRS)  Nasopharyngeal Nasopharyngeal Swab     Status: None   Collection Time: 03/31/19 12:24 PM   Specimen: Nasopharyngeal Swab  Result Value Ref Range Status   SARS Coronavirus 2 NEGATIVE NEGATIVE Final    Comment: (NOTE) SARS-CoV-2 target nucleic acids are NOT DETECTED. The SARS-CoV-2 RNA is generally detectable in upper and lower respiratory specimens during the acute phase of infection. Negative results do not preclude SARS-CoV-2 infection, do not rule out co-infections with other pathogens, and should not be used as the sole basis for treatment or other patient management decisions. Negative results must be combined with clinical observations, patient history, and epidemiological information. The expected result is Negative. Fact Sheet for Patients: SugarRoll.be Fact Sheet for Healthcare Providers: https://www.woods-mathews.com/ This test is not yet approved or cleared by the [place] FDA and  has been authorized for detection and/or diagnosis of SARS-CoV-2 by FDA under an Emergency Use Authorization (EUA). This EUA will remain  in effect (meaning this  test can be used) for the duration of the COVID-19 declaration under Section 56 4(b)(1) of the Act, 21 U.S.C. section 360bbb-3(b)(1), unless the authorization is terminated or revoked sooner. Performed at [facility], [place] [address] [place], [place] [address]   MRSA PCR Screening     Status: None   Collection Time: 03/31/19  5:06 PM   Specimen: Nasal Mucosa; Nasopharyngeal  Result Value Ref Range Status   MRSA by PCR NEGATIVE NEGATIVE Final    Comment:        The GeneXpert MRSA Assay (FDA approved for NASAL specimens only), is one component of a comprehensive MRSA colonization surveillance program. It is not intended to diagnose MRSA infection nor to guide or monitor treatment for MRSA infections. Performed at [facility], [address] [place], [place] [address]          Radiology Studies: DG CHEST PORT 1 VIEW  Result Date: 04/01/2019 CLINICAL DATA:  Hypoxia in the ICU with history of metastatic lung cancer EXAM: PORTABLE CHEST 1 VIEW COMPARISON:  Radiograph 03/31/2019 FINDINGS: Right IJ approach Port-A-Cath tip terminates at the level of the right atrium. Port is currently accessed via [place] needle. There is some increasing vascular congestion with hazy interstitial changes which could reflect developing edema. Cardiomediastinal contours are unchanged from prior with a calcified, tortuous aorta. Degenerative changes are present in the imaged spine and shoulders. No acute osseous or soft tissue abnormality. Telemetry leads overlie the chest. IMPRESSION: 1. Increasing vascular congestion with hazy interstitial changes which could reflect developing edema. 2. Right IJ approach Port-A-Cath tip terminates at the level of the right atrium. Electronically Signed   By: Lovena Le M.D.   On: 04/01/2019 04:52   EEG adult  Result Date: 04/01/2019 Lora Havens, MD     04/01/2019  9:55 AM Patient Name: Kevin [name] MRN: [id number] Epilepsy Attending: Lora Havens  Referring Physician/Provider: Dr. Heath Lark Date: 03/31/2018 Duration: 24.10 minutes Patient history: 64 year old male with metastatic lung cancer with brain mets, left temporoparietal craniotomy with altered mental status.  EEG to evaluate for seizures. Level of alertness: Lethargic AEDs during EEG study: Keppra Technical aspects: This EEG study was done with scalp electrodes positioned according to the 10-20 International system of electrode placement. Electrical activity was acquired at a sampling rate of 500Hz  and reviewed with a high frequency filter of 70Hz  and a low frequency filter of 1Hz . EEG data were recorded continuously and digitally stored. Description: EEG showed frequent spikes at 1 to 1.5  Hz with overriding fast activity in left temporoparietal region.  EEG also showed continuous generalized and lateralized left hemisphere 2 to 3 Hz slowing admixed with 13 to 15 Hz generalized beta activity.  Hyperventilation for sedation were not performed. Abnormalities -Spikes, left temporoparietal region Continued slow, generalized and lateralized left hemisphere IMPRESSION: This study showed evidence of epileptogenicity in left temporoparietal region as well as cortical dysfunction in the left hemisphere consistent with underlying encephalomalacia as well as craniotomy.  Additionally, there was evidence of severe diffuse encephalopathy, nonspecific to etiology.  No seizures were seen throughout the recording. Dr Roderic Palau was notified immediately. Priyanka Barbra Sarks        Scheduled Meds: . aspirin  81 mg Oral Daily  . chlorhexidine  15 mL Mouth Rinse BID  . Chlorhexidine Gluconate Cloth  6 each Topical Daily  . enoxaparin (LOVENOX) injection  40 mg Subcutaneous Q24H  . insulin aspart  0-5 Units Subcutaneous QHS  . insulin aspart  0-6 Units Subcutaneous TID WC  . mouth rinse  15 mL Mouth Rinse BID  . montelukast  10 mg Oral QHS  . sodium chloride flush  10-40 mL Intracatheter Q12H   Continuous  Infusions: . sodium chloride Stopped (04/02/19 1641)  . ampicillin-sulbactam (UNASYN) IV Stopped (04/02/19 1144)  . lacosamide (VIMPAT) IV Stopped (04/02/19 1048)  . levETIRAcetam 1,000 mg (04/02/19 1808)     LOS: 2 days    Time spent: 7mins    Kathie Dike, MD [facility]   If 7PM-7AM, please contact night-coverage [email or web address]  04/02/2019, 6:16 PM

## 2019-04-02 NOTE — Evaluation (Signed)
Clinical/Bedside Swallow Evaluation Patient Details  Name: Kevin [name] MRN: [id number] Date of Birth: 07-16-1955  Today's Date: 04/02/2019 Time: SLP Start Time (ACUTE ONLY): 1937 SLP Stop Time (ACUTE ONLY): 0945 SLP Time Calculation (min) (ACUTE ONLY): 18 min  Past Medical History:  Past Medical History:  Diagnosis Date  . Cancer [facility])    Lung cancer with brain metastisis  . Hyperlipidemia   . Hypertension   . Restless legs syndrome (RLS)   . Seasonal allergies   . Smoker    Quit 08/04/12  . Ulcer 1988   Past Surgical History:  Past Surgical History:  Procedure Laterality Date  . APPENDECTOMY  1957   hernia repair at the same time  . [place]  2000, 2002, 2007   bilateral  2x's on right and 1x on the left  . TONSILECTOMY, ADENOIDECTOMY, BILATERAL MYRINGOTOMY AND TUBES  1965   HPI:  Kevin [name] is a 64 y.o. male with medical history significant for lung cancer with brain metastasis, dyslipidemia, hypertension, possible seizures, and diabetes who was brought to the ED via EMS after his mother found him on the floor unresponsive.  She was going to take him for further radiation and is noted to have improvement in the size of his metastatic disease recently.  EDP had discussed further with the patient's brother who states that he has been having some worsening confusion and lethargy over the course the last 3 weeks.  He is slightly more alert and responsive in the ED.  No other pertinent history can be obtained.  No reported urinary incontinence or tongue biting or other seizure activity noted. CT of the head with no acute findings aside from stable left-sided vasogenic edema.  He has had brain MRI in the last 30 days with noted regression in metastatic disease.  Chest x-ray with minimal atelectasis noted bilaterally. BSE requested.   Assessment / Plan / Recommendation Clinical Impression  Pt presents with cognitive based dysphagia in setting of acute medical decline  with weakness. Pt alert, but disoriented during the evaluation. Pt with reduced awareness to task with oral holding of bolus and labial spillage. Pt with suspected delay in swallow initiation and delayed coughing. Pt is currently inappropriate for po due to mentation, however recommend oral care and ice chips when alert and upright. SLP will follow tomorrow.   SLP Visit Diagnosis: Dysphagia, unspecified (R13.10)    Aspiration Risk  Moderate aspiration risk;Risk for inadequate nutrition/hydration    Diet Recommendation NPO;Ice chips PRN after oral care   Medication Administration: Via alternative means    Other  Recommendations Oral Care Recommendations: Oral care QID;Oral care prior to ice chip/H20;Staff/trained caregiver to provide oral care   Follow up Recommendations Skilled Nursing facility      Frequency and Duration min 2x/week  1 week       Prognosis Prognosis for Safe Diet Advancement: Guarded Barriers to Reach Goals: Severity of deficits;Cognitive deficits      Swallow Study   General Date of Onset: 03/31/19 HPI: Kevin [name] is a 64 y.o. male with medical history significant for lung cancer with brain metastasis, dyslipidemia, hypertension, possible seizures, and diabetes who was brought to the ED via EMS after his mother found him on the floor unresponsive.  She was going to take him for further radiation and is noted to have improvement in the size of his metastatic disease recently.  EDP had discussed further with the patient's brother who states that he has been  having some worsening confusion and lethargy over the course the last 3 weeks.  He is slightly more alert and responsive in the ED.  No other pertinent history can be obtained.  No reported urinary incontinence or tongue biting or other seizure activity noted. CT of the head with no acute findings aside from stable left-sided vasogenic edema.  He has had brain MRI in the last 30 days with noted regression in  metastatic disease.  Chest x-ray with minimal atelectasis noted bilaterally. BSE requested. Type of Study: Bedside Swallow Evaluation Diet Prior to this Study: NPO Temperature Spikes Noted: No Respiratory Status: Nasal cannula History of Recent Intubation: No Behavior/Cognition: Alert;Requires cueing;Doesn't follow directions Oral Cavity Assessment: Within Functional Limits Oral Care Completed by SLP: Yes Oral Cavity - Dentition: Adequate natural dentition Vision: Impaired for self-feeding Self-Feeding Abilities: Total assist Patient Positioning: Upright in bed Baseline Vocal Quality: Normal Volitional Cough: Cognitively unable to elicit Volitional Swallow: Unable to elicit    Oral/Motor/Sensory Function Overall Oral Motor/Sensory Function: Mild impairment Facial Strength: Reduced right   Ice Chips Ice chips: Impaired Presentation: Spoon Oral Phase Impairments: Reduced lingual movement/coordination;Poor awareness of bolus Pharyngeal Phase Impairments: Suspected delayed Swallow   Thin Liquid Thin Liquid: Impaired Presentation: Cup Oral Phase Impairments: Reduced labial seal;Reduced lingual movement/coordination;Poor awareness of bolus Oral Phase Functional Implications: Right anterior spillage Pharyngeal  Phase Impairments: Suspected delayed Swallow;Cough - Delayed    Nectar Thick Nectar Thick Liquid: Not tested   Honey Thick     Puree Puree: Not tested   Solid     Solid: Not tested     Thank you,  Genene Churn, [place]  Natividad Halls 04/02/2019,1:08 PM

## 2019-04-02 NOTE — Evaluation (Signed)
Physical Therapy Evaluation Patient Details Name: Kevin [name] MRN: [id number] DOB: [birth year] Today's Date: 04/02/2019   History of Present Illness  Kevin [name] is a 64 y.o. male with medical history significant for lung cancer with brain metastasis, dyslipidemia, hypertension, possible seizures, and diabetes who was brought to the ED via EMS after his mother found him on the floor unresponsive.  She was going to take him for further radiation and is noted to have improvement in the size of his metastatic disease recently.  EDP had discussed further with the patient's brother who states that he has been having some worsening confusion and lethargy over the course the last 3 weeks.  He is slightly more alert and responsive in the ED.  No other pertinent history can be obtained.  No reported urinary incontinence or tongue biting or other seizure activity noted.    Clinical Impression  Patient able to follow instructions with repeated verbal/tactile cueing with fair carryover, requires increased time to respond to directions before attempting functional tasks, able to stand using RW, but very unsteady on feet and at high risk for falls, limited to a few shuffling side steps before put back to bed.  Patient will benefit from continued physical therapy in hospital and recommended venue below to increase strength, balance, endurance for safe ADLs and gait.    Follow Up Recommendations SNF    Equipment Recommendations  None recommended by PT    Recommendations for Other Services       Precautions / Restrictions Precautions Precautions: Fall Restrictions Weight Bearing Restrictions: No      Mobility  Bed Mobility Overal bed mobility: Needs Assistance Bed Mobility: Supine to Sit;Sit to Supine     Supine to sit: Mod assist;Max assist Sit to supine: Mod assist;Max assist   General bed mobility comments: increased time, labored movement, requires constant verbal/tactile cueing to  follow directions  Transfers Overall transfer level: Needs assistance Equipment used: Rolling walker (2 wheeled) Transfers: Sit to/from Stand Sit to Stand: Mod assist         General transfer comment: unsteady on feet with difficulty holding onto RW with hands mostly due to confusion  Ambulation/Gait Ambulation/Gait assistance: Max assist Gait Distance (Feet): 3 Feet Assistive device: Rolling walker (2 wheeled) Gait Pattern/deviations: Decreased step length - right;Decreased step length - left;Decreased stride length Gait velocity: slow   General Gait Details: limited to 3-4 unsteady shuffling side steps at bedside due to weakness/poor standing balance  Stairs            Wheelchair Mobility    Modified Rankin (Stroke Patients Only)       Balance Overall balance assessment: Needs assistance Sitting-balance support: Feet supported;No upper extremity supported Sitting balance-Leahy Scale: Poor Sitting balance - Comments: frequent leaning forward and to the right seated at EOB Postural control: Right lateral lean Standing balance support: During functional activity;Bilateral upper extremity supported Standing balance-Leahy Scale: Poor Standing balance comment: fair/poor using RW                             Pertinent Vitals/Pain Pain Assessment: No/denies pain    Home Living Family/patient expects to be discharged to:: Private residence                 Additional Comments: Patient is a poor historian    Prior Function Level of Independence: Independent with assistive device(s)         Comments: 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        Extremity/Trunk Assessment   Upper Extremity Assessment Upper Extremity Assessment: Generalized weakness    Lower Extremity Assessment Lower Extremity Assessment: Generalized weakness    Cervical / Trunk Assessment Cervical / Trunk Assessment: Kyphotic  Communication    Communication: Other (comment)(appears confused)  Cognition Arousal/Alertness: Awake/alert Behavior During Therapy: Restless;Impulsive Overall Cognitive Status: No family/caregiver present to determine baseline cognitive functioning                                        General Comments      Exercises     Assessment/Plan    PT Assessment Patient needs continued PT services  PT Problem List Decreased strength;Decreased activity tolerance;Decreased balance;Decreased mobility       PT Treatment Interventions Functional mobility training;Therapeutic activities;Therapeutic exercise;Patient/family education;Gait training;Stair training    PT Goals (Current goals can be found in the Care Plan section)  Acute Rehab PT Goals Patient Stated Goal: return home PT Goal Formulation: With patient Time For Goal Achievement: 04/16/19 Potential to Achieve Goals: Fair    Frequency Min 3X/week   Barriers to discharge        Co-evaluation               AM-PAC PT "6 Clicks" Mobility  Outcome Measure Help needed turning from your back to your side while in a flat bed without using bedrails?: A Lot Help needed moving from lying on your back to sitting on the side of a flat bed without using bedrails?: A Lot Help needed moving to and from a bed to a chair (including a wheelchair)?: A Lot Help needed standing up from a chair using your arms (e.g., wheelchair or bedside chair)?: A Lot Help needed to walk in hospital room?: Total Help needed climbing 3-5 steps with a railing? : Total 6 Click Score: 10    End of Session   Activity Tolerance: Patient tolerated treatment well;Patient limited by fatigue Patient left: in bed;with call bell/phone within reach;with bed alarm set Nurse Communication: Mobility status PT Visit Diagnosis: Unsteadiness on feet (R26.81);Other abnormalities of gait and mobility (R26.89);Muscle weakness (generalized) (M62.81)    Time: 8786-7672 PT  Time Calculation (min) (ACUTE ONLY): 31 min   Charges:   PT Evaluation $PT Eval Moderate Complexity: 1 Mod PT Treatments $Therapeutic Activity: 23-37 mins        12:08 PM, 04/02/19 Lonell Grandchild, MPT Physical Therapist with [facility] [phone] office [phone] mobile phone

## 2019-04-02 NOTE — Progress Notes (Signed)
Palliative: Kevin [name] [name], Kevin [name], is more alert today, oriented to self only.  He is unable to make his basic needs known at this time.  There is no family at bedside at this time.   Brother Kevin [name] [name] seen in hallway.  We talk about some improvements, but no meaningful improvements. We talk about time for outcomes and disposition choices  Mother Kevin [name] [name] seen at husband's [name]) bedside.  We talk about the improvements so far today. We talk about looking for meaningful improvement.  Kevin [name] [name] shares that Kevin [name] has had decline over last few months.  She shares that he was unsafe at home 4 weeks ago and she feels that he will not be able to care for himself at home.  Kevin [name] [name] also states that Kevin [name] would not want to go to rehab.   We talk about hospice services, at home and at [facility] facility.  Kevin [name] [name] states she thinks residential hospice would be a good choice if Kevin [name] does not have meaningful improvement. (choice is [place]).   Time for outcomes.   Conference with attending and bedside nursing staff related to patient condition, needs, [name] discussions.   Plan:  24-48 hours for outcomes.  Considering comfort measures only, residential hospice if no improvements.     55 minutes, extended time.  Kevin [name] Axe, NP Palliative Medicine Team Team Phone # [phone] Greater than 50% of this time was spent counseling and coordinating care related to the above assessment and plan.

## 2019-04-02 NOTE — Progress Notes (Signed)
Pt's brother is visiting at this time and updated on pt's status. Pt is resting with his eyes open. Brother does not interact with patient and asked this RN "is this an improvement?". Updated brother that pt was completely unresponsive for most of the day. Pt is able to state name today and is moving all extremities.

## 2019-04-02 NOTE — Plan of Care (Signed)
  Problem: Acute Rehab PT Goals(only PT should resolve) Goal: Pt Will Go Supine/Side To Sit Outcome: Progressing Flowsheets (Taken 04/02/2019 1209) Pt will go Supine/Side to Sit: with minimal assist Goal: Patient Will Transfer Sit To/From Stand Outcome: Progressing Flowsheets (Taken 04/02/2019 1209) Patient will transfer sit to/from stand: with minimal assist Goal: Pt Will Transfer Bed To Chair/Chair To Bed Outcome: Progressing Flowsheets (Taken 04/02/2019 1209) Pt will Transfer Bed to Chair/Chair to Bed: with min assist Goal: Pt Will Ambulate Outcome: Progressing Flowsheets (Taken 04/02/2019 1209) Pt will Ambulate:  25 feet  with minimal assist  with moderate assist  with rolling walker   12:10 PM, 04/02/19 Lonell Grandchild, MPT Physical Therapist with [facility] [phone] office [phone] mobile phone

## 2019-04-02 NOTE — TOC Initial Note (Signed)
Transition of Care [facility]) - Initial/Assessment Note    Patient Details  Name: Kevin [name] MRN: [id number] Date of Birth: 09-15-1955  [facility] CM/SW Contact:    Kattie Santoyo, Chauncey [place], RN Phone Number: 04/02/2019, 1:42 PM  Clinical Narrative: History of lung cancer and brain mets, possible seizures, diabetes, hypertension, admitted to the hospital when he was found down at home, unresponsive for an unknown period of time.   Recommended for SNF. Discussed with brother, Marya Amsler, only family on chart, Marya Amsler reports a decline over past 2-3 weeks.  Marya Amsler is agreeable to SNF referral.              Expected Discharge Plan: Skilled Nursing Facility Barriers to Discharge: Continued Medical Work up, SNF Pending bed offer   Patient Goals and CMS Choice Patient states their goals for this hospitalization and ongoing recovery are:: brother agreeable to short term rehab CMS Medicare.gov Compare Post Acute Care list provided to:: Patient Represenative (must comment)(brother) Choice offered to / list presented to : Sibling  Expected Discharge Plan and Services Expected Discharge Plan: [place]   Discharge Planning Services: CM Consult Post Acute Care Choice: Nursing Home Living arrangements for the past 2 months: Single Family Home                                      Prior Living Arrangements/Services Living arrangements for the past 2 months: Single Family Home Lives with:: Self          Need for Family Participation in Patient Care: Yes (Comment) Care giver support system in place?: Yes (comment) Current home services: DME    Activities of Daily Living   ADL Screening (condition at time of admission) Patient's cognitive ability adequate to safely complete daily activities?: No      Emotional Assessment   Attitude/Demeanor/Rapport: Lethargic(intermittent)   Orientation: : Oriented to Self(responds to name) Alcohol / Substance Use: Not  Applicable Psych Involvement: No (comment)  Admission diagnosis:  Hypokalemia [E87.6] Hypomagnesemia [E83.42] Seizure ([place]) [R56.9] Acute encephalopathy [G93.40] Altered mental status, unspecified altered mental status type [R41.82] Patient Active Problem List   Diagnosis Date Noted  . Seizure disorder ([place]) 04/01/2019  . Acute respiratory failure with hypoxia ([place]) 04/01/2019  . Altered mental status   . Goals of care, counseling/discussion   . Palliative care by specialist   . DNR (do not resuscitate) discussion   . Acute encephalopathy 03/31/2019  . Tobacco abuse 07/23/2012  . HTN (hypertension) 07/23/2012  . RLS (restless legs syndrome) 07/23/2012  . HLD (hyperlipidemia) 07/23/2012  . Hypertension   . Restless legs syndrome (RLS)    PCP:  Celene Squibb, MD Pharmacy:   [facility], [address] [place] [address] Phone: [phone] Fax: [phone]     Social Determinants of Health (SDOH) Interventions    Readmission Risk Interventions No flowsheet data found.

## 2019-04-03 ENCOUNTER — Inpatient Hospital Stay (HOSPITAL_COMMUNITY)
Admission: RE | Admit: 2019-04-03 | Discharge: 2019-04-28 | DRG: 951 | Disposition: E | Source: Hospice | Attending: Internal Medicine | Admitting: Internal Medicine

## 2019-04-03 DIAGNOSIS — J9601 Acute respiratory failure with hypoxia: Secondary | ICD-10-CM | POA: Diagnosis not present

## 2019-04-03 DIAGNOSIS — C7931 Secondary malignant neoplasm of brain: Secondary | ICD-10-CM | POA: Diagnosis present

## 2019-04-03 DIAGNOSIS — G2581 Restless legs syndrome: Secondary | ICD-10-CM | POA: Diagnosis present

## 2019-04-03 DIAGNOSIS — C3490 Malignant neoplasm of unspecified part of unspecified bronchus or lung: Secondary | ICD-10-CM | POA: Diagnosis present

## 2019-04-03 DIAGNOSIS — G9340 Encephalopathy, unspecified: Secondary | ICD-10-CM | POA: Diagnosis present

## 2019-04-03 DIAGNOSIS — Z7289 Other problems related to lifestyle: Secondary | ICD-10-CM

## 2019-04-03 DIAGNOSIS — Z87891 Personal history of nicotine dependence: Secondary | ICD-10-CM | POA: Diagnosis not present

## 2019-04-03 DIAGNOSIS — I10 Essential (primary) hypertension: Secondary | ICD-10-CM | POA: Diagnosis present

## 2019-04-03 DIAGNOSIS — G9349 Other encephalopathy: Secondary | ICD-10-CM | POA: Diagnosis present

## 2019-04-03 DIAGNOSIS — G40909 Epilepsy, unspecified, not intractable, without status epilepticus: Secondary | ICD-10-CM | POA: Diagnosis present

## 2019-04-03 DIAGNOSIS — Z79899 Other long term (current) drug therapy: Secondary | ICD-10-CM | POA: Diagnosis not present

## 2019-04-03 DIAGNOSIS — J690 Pneumonitis due to inhalation of food and vomit: Secondary | ICD-10-CM | POA: Diagnosis present

## 2019-04-03 DIAGNOSIS — E1169 Type 2 diabetes mellitus with other specified complication: Secondary | ICD-10-CM | POA: Diagnosis present

## 2019-04-03 DIAGNOSIS — W19XXXA Unspecified fall, initial encounter: Secondary | ICD-10-CM | POA: Diagnosis present

## 2019-04-03 DIAGNOSIS — Z515 Encounter for palliative care: Secondary | ICD-10-CM | POA: Diagnosis not present

## 2019-04-03 DIAGNOSIS — Z20822 Contact with and (suspected) exposure to covid-19: Secondary | ICD-10-CM | POA: Diagnosis present

## 2019-04-03 DIAGNOSIS — R4182 Altered mental status, unspecified: Secondary | ICD-10-CM | POA: Diagnosis not present

## 2019-04-03 DIAGNOSIS — Z801 Family history of malignant neoplasm of trachea, bronchus and lung: Secondary | ICD-10-CM

## 2019-04-03 DIAGNOSIS — Z66 Do not resuscitate: Secondary | ICD-10-CM | POA: Diagnosis not present

## 2019-04-03 DIAGNOSIS — Z7189 Other specified counseling: Secondary | ICD-10-CM

## 2019-04-03 DIAGNOSIS — E785 Hyperlipidemia, unspecified: Secondary | ICD-10-CM | POA: Diagnosis present

## 2019-04-03 DIAGNOSIS — J302 Other seasonal allergic rhinitis: Secondary | ICD-10-CM | POA: Diagnosis present

## 2019-04-03 LAB — GLUCOSE, CAPILLARY
Glucose-Capillary: 86 mg/dL (ref 70–99)
Glucose-Capillary: 83 mg/dL (ref 70–99)

## 2019-04-03 MED ORDER — MORPHINE SULFATE (PF) 2 MG/ML IV SOLN: 2.0000 mg | INTRAVENOUS | Status: DC | PRN

## 2019-04-03 MED ORDER — HALOPERIDOL 0.5 MG PO TABS: 0.5000 mg | ORAL_TABLET | ORAL | Status: DC | PRN

## 2019-04-03 MED ORDER — ONDANSETRON HCL 4 MG/2ML IJ SOLN: 4.0000 mg | Freq: Four times a day (QID) | INTRAMUSCULAR | Status: DC | PRN

## 2019-04-03 MED ORDER — LORAZEPAM 1 MG PO TABS: 1.0000 mg | ORAL_TABLET | ORAL | Status: DC | PRN

## 2019-04-03 MED ORDER — LORAZEPAM 2 MG/ML IJ SOLN
1.0000 mg | INTRAMUSCULAR | Status: DC | PRN
  Administered 2019-04-03 – 2019-04-05 (×7): 1 mg via INTRAVENOUS
  Filled 2019-04-03 (×4): qty 1

## 2019-04-03 MED ORDER — HALOPERIDOL LACTATE 5 MG/ML IJ SOLN: 0.5000 mg | INTRAMUSCULAR | Status: DC | PRN

## 2019-04-03 MED ORDER — POLYVINYL ALCOHOL 1.4 % OP SOLN: 1.0000 [drp] | Freq: Four times a day (QID) | OPHTHALMIC | Status: DC | PRN

## 2019-04-03 MED ORDER — ACETAMINOPHEN 325 MG PO TABS: 650.0000 mg | ORAL_TABLET | Freq: Four times a day (QID) | ORAL | Status: DC | PRN

## 2019-04-03 MED ORDER — GLYCOPYRROLATE 0.2 MG/ML IJ SOLN: 0.2000 mg | INTRAMUSCULAR | Status: DC | PRN

## 2019-04-03 MED ORDER — ACETAMINOPHEN 650 MG RE SUPP
650.0000 mg | Freq: Four times a day (QID) | RECTAL | Status: DC | PRN
  Administered 2019-04-04: 09:00:00 650 mg via RECTAL
  Filled 2019-04-03: qty 1

## 2019-04-03 MED ORDER — LORAZEPAM 2 MG/ML IJ SOLN
1.0000 mg | INTRAMUSCULAR | Status: DC | PRN
  Filled 2019-04-03 (×3): qty 1

## 2019-04-03 MED ORDER — LORAZEPAM 2 MG/ML IJ SOLN: 2.0000 mg | INTRAMUSCULAR | Status: DC | PRN

## 2019-04-03 MED ORDER — SCOPOLAMINE 1 MG/3DAYS TD PT72
1.0000 | MEDICATED_PATCH | TRANSDERMAL | Status: DC
  Administered 2019-04-03: 1.5 mg via TRANSDERMAL
  Filled 2019-04-03: qty 1

## 2019-04-03 MED ORDER — BIOTENE DRY MOUTH MT LIQD: 15.0000 mL | OROMUCOSAL | Status: DC | PRN

## 2019-04-03 MED ORDER — HALOPERIDOL LACTATE 2 MG/ML PO CONC
0.5000 mg | ORAL | Status: DC | PRN
  Filled 2019-04-03: qty 0.3

## 2019-04-03 MED ORDER — LORAZEPAM 2 MG/ML PO CONC: 1.0000 mg | ORAL | Status: DC | PRN

## 2019-04-03 MED ORDER — MORPHINE SULFATE (PF) 2 MG/ML IV SOLN
1.0000 mg | INTRAVENOUS | Status: DC | PRN
  Administered 2019-04-04 – 2019-04-05 (×7): 1 mg via INTRAVENOUS
  Filled 2019-04-03 (×7): qty 1

## 2019-04-03 MED ORDER — GLYCOPYRROLATE 1 MG PO TABS: 1.0000 mg | ORAL_TABLET | ORAL | Status: DC | PRN

## 2019-04-03 MED ORDER — ATROPINE SULFATE 1 % OP SOLN: 2.0000 [drp] | Freq: Four times a day (QID) | OPHTHALMIC | Status: DC | PRN

## 2019-04-03 MED ORDER — ONDANSETRON 4 MG PO TBDP: 4.0000 mg | ORAL_TABLET | Freq: Four times a day (QID) | ORAL | Status: DC | PRN

## 2019-04-03 NOTE — Progress Notes (Signed)
  Speech Language Pathology Treatment: Dysphagia  Patient Details Name: Kevin [name] MRN: [id number] DOB: [birth year] Today's Date: 04/08/2019 Time: 7829-5621 SLP Time Calculation (min) (ACUTE ONLY): 18 min  Assessment / Plan / Recommendation Clinical Impression  Pt seen for reassessment for appropriateness for po intake. Pt continues to be confused, however alert. He was agreeable to ice chips and water. Pt with reduced labial closure resulting in labial spillage on the right side. He continues to demonstrate poor po readiness due to cognitive deficits and hold bolus orally with no swallow elicited at times. Pt without overt coughing, however swallow is absent at times and Pt with audible secretions and inability to clear. He cannot swallow or cough on command. Oral care and suction completed, however little return for suction due to depth of secretions. After visit, Kevin Axe, NP, made SLP aware that Pt will move to comfort care. Consider comfort feeds if Pt desires, mostly oral care, ice chips, and small sips of water. SLP will sign off at this time. Reconsult if needs arise.    HPI HPI: Kevin [name] is a 64 y.o. male with medical history significant for lung cancer with brain metastasis, dyslipidemia, hypertension, possible seizures, and diabetes who was brought to the ED via EMS after his mother found him on the floor unresponsive.  She was going to take him for further radiation and is noted to have improvement in the size of his metastatic disease recently.  EDP had discussed further with the patient's brother who states that he has been having some worsening confusion and lethargy over the course the last 3 weeks.  He is slightly more alert and responsive in the ED.  No other pertinent history can be obtained.  No reported urinary incontinence or tongue biting or other seizure activity noted. CT of the head with no acute findings aside from stable left-sided vasogenic edema.  He has had brain  MRI in the last 30 days with noted regression in metastatic disease.  Chest x-ray with minimal atelectasis noted bilaterally. BSE requested.      SLP Plan  Discharge SLP treatment due to (comment)(Pt now moving to comfort care per Kevin Axe, NP)       Recommendations  Diet recommendations: NPO(consider comfort feeds if Pt requests) Medication Administration: Via alternative means Supervision: Full supervision/cueing for compensatory strategies                Oral Care Recommendations: Oral care prior to ice chip/H20;Staff/trained caregiver to provide oral care;Oral care QID Follow up Recommendations: Other (comment)(Pt will be comfort care) SLP Visit Diagnosis: Dysphagia, unspecified (R13.10) Plan: Discharge SLP treatment due to (comment)(Pt now moving to comfort care per Kevin Axe, NP)       Thank you,  Kevin [name], [place]                 [place] 04/04/2019, 12:26 PM

## 2019-04-03 NOTE — Discharge Summary (Signed)
Physician Discharge Summary  Kevin [name]:[id number] DOB: 09/15/1955 DOA: 03/31/2019  PCP: Kevin Squibb, MD  Admit date: 03/31/2019 Discharge date: 03/30/2019  Admitted From: Home Disposition: Residential hospice  Recommendations for Outpatient Follow-up:  1. Patient is to be discharged residential hospice once bed is available.  He will be transitioned to GIP status  Brief/Interim Summary: 64 year old male with a history of lung cancer and brain mets, possible seizures, diabetes, hypertension, admitted to the hospital when he was found down at home, unresponsive for an unknown period of time.  Patient was found by his mother.  He was brought to the emergency room where CT head did not show any acute findings other than stable left-sided vasogenic edema.  He was noted to have significant low potassium of 2.7.  He was lethargic and unable to answer questions.  He was admitted for further treatments.  During his hospital course, he became increasingly hypoxic requiring BiPAP therapy.  Remains unresponsive at this time.  Discharge Diagnoses:  Active Problems:   HLD (hyperlipidemia)   Acute encephalopathy   Altered mental status   Goals of care, counseling/discussion   Palliative care by specialist   DNR (do not resuscitate) discussion   Seizure disorder (Kevin [name])   Acute respiratory failure with hypoxia (Kevin [name])   Encounter for hospice care discussion  1. Acute encephalopathy in the setting of metastatic brain disease. Unclear etiology.  EEG indicated persistent epileptiform discharges, despite increased dose of Keppra.  Discussed with neurology and he was started on Vimpat.  Overall mental status had mild improvement of he was still confused, agitated and did not improve to his baseline.  2. Acute respiratory failure with hypoxia possibly related to aspiration pneumonia.  Respiratory was suctioning thick secretions from respiratory tract.  He was on BiPAP on 1/5, but has since been weaned off.   Prior to admission, he was found laying on the ground for an unknown period of time.  He was treated with Unasyn to cover for any aspiration pneumonia especially since he is immunocompromised from chemotherapy.. 3. Hypokalemia and hypomagnesemia.  Replace 4. Mild hyponatremia.  Related to HCTZ use.  On hydration. 5. History of lung cancer with metastatic brain disease.  Currently on radiation and chemotherapy.   6. Hypertension.  Blood pressures currently stable.  Resume home medications as mental status improves. 7. Seizure disorder.  Keppra dose was increased and he was started on Vimpat. 8. Goals of care.  Family confirms that patient is DNR.  Palliative care following to assist in conversations around further goals.  After several discussions, due to patient's lack of meaningful improvement, family has elected to transition to comfort care and have requested residential hospice.  Discharge Instructions  Discharge Instructions    Diet - low sodium heart healthy   Complete by: As directed    Increase activity slowly   Complete by: As directed      Allergies as of 04/06/2019   No Known Allergies     Medication List    STOP taking these medications   aspirin 81 MG tablet   ciprofloxacin 500 MG tablet Commonly known as: Cipro   Fish Oil 1000 MG Caps   fluconazole 100 MG tablet Commonly known as: DIFLUCAN   furosemide 40 MG tablet Commonly known as: LASIX   levETIRAcetam 500 MG tablet Commonly known as: Keppra   metFORMIN 500 MG 24 hr tablet Commonly known as: GLUCOPHAGE-XR   montelukast 10 MG tablet Commonly known as: SINGULAIR   multivitamin tablet  ondansetron 8 MG tablet Commonly known as: ZOFRAN   potassium chloride SA 20 MEQ tablet Commonly known as: KLOR-CON   pravastatin 40 MG tablet Commonly known as: PRAVACHOL   telmisartan 80 MG tablet Commonly known as: MICARDIS   Vitamin D3 125 MCG (5000 UT) Tabs   zolpidem 10 MG tablet Commonly known as:  AMBIEN       No Known Allergies  Consultations:  Palliative care  Neurology   Procedures/Studies: CT Head Wo Contrast  Result Date: 03/31/2019 CLINICAL DATA:  Pt's mother called ems for altered mental status. She was going to pick him up for his radiation tx today due to brain ca and found him "unresponsive". Pt will respond to his name but nothing else.Focal neuro deficit, > 6 hrs, stroke suspected EXAM: CT HEAD WITHOUT CONTRAST TECHNIQUE: Contiguous axial images were obtained from the base of the skull through the vertex without intravenous contrast. COMPARISON:  Head CT 03/04/2019, brain MRI 10/17/2018 FINDINGS: Brain: Patient status post LEFT craniotomy and tumor section. Encephalomalacia and vasogenic edema within the LEFT parietal lobe and LEFT temporal lobe are similar to similar comparison exam. No acute intracranial hemorrhage. No midline shift or mass effect. No CT evidence of cortical infarction. Vascular: No hyperdense vessel or unexpected calcification. Skull: LEFT parietal craniotomy. No acute findings Sinuses/Orbits: No acute finding. Other: IMPRESSION: 1. No CT evidence of acute cortical infarction. 2. Postsurgical encephalomalacia and vasogenic edema in the LEFT temporoparietal lobe unchanged from comparison exam. No acute intracranial findings. Electronically Signed   By: Suzy Bouchard M.D.   On: 03/31/2019 10:28   DG CHEST PORT 1 VIEW  Result Date: 04/01/2019 CLINICAL DATA:  Hypoxia in the ICU with history of metastatic lung cancer EXAM: PORTABLE CHEST 1 VIEW COMPARISON:  Radiograph 03/31/2019 FINDINGS: Right IJ approach Port-A-Cath tip terminates at the level of the right atrium. Port is currently accessed via [place] needle. There is some increasing vascular congestion with hazy interstitial changes which could reflect developing edema. Cardiomediastinal contours are unchanged from prior with a calcified, tortuous aorta. Degenerative changes are present in the imaged spine  and shoulders. No acute osseous or soft tissue abnormality. Telemetry leads overlie the chest. IMPRESSION: 1. Increasing vascular congestion with hazy interstitial changes which could reflect developing edema. 2. Right IJ approach Port-A-Cath tip terminates at the level of the right atrium. Electronically Signed   By: Lovena Le M.D.   On: 04/01/2019 04:52   DG CHEST PORT 1 VIEW  Result Date: 03/31/2019 CLINICAL DATA:  Chest congestion, lethargy, recent seizure EXAM: PORTABLE CHEST 1 VIEW COMPARISON:  03/31/2019 FINDINGS: Unchanged AP portable examination. No acute appearing airspace opacity. Right chest port catheter. Heart mediastinum are unremarkable. IMPRESSION: Unchanged AP portable examination. No acute appearing airspace opacity. Electronically Signed   By: Eddie Candle M.D.   On: 03/31/2019 16:58   DG Chest Portable 1 View  Result Date: 03/31/2019 CLINICAL DATA:  Hypoxia EXAM: PORTABLE CHEST 1 VIEW COMPARISON:  03/04/2019 FINDINGS: Right chest wall port catheter is unchanged. Probable minor atelectasis at the lung bases. Stable cardiomediastinal contours. No pleural effusion or pneumothorax. IMPRESSION: Minor bibasilar atelectasis. Electronically Signed   By: Macy Mis M.D.   On: 03/31/2019 09:12   EEG adult  Result Date: 04/01/2019 Lora Havens, MD     04/01/2019  9:55 AM Patient Name: Kevin [name] MRN: [id number] Epilepsy Attending: Lora Havens Referring Physician/Provider: Dr. Heath Lark Date: 03/31/2018 Duration: 24.10 minutes Patient history: 64 year old male with metastatic lung cancer with  brain mets, left temporoparietal craniotomy with altered mental status.  EEG to evaluate for seizures. Level of alertness: Lethargic AEDs during EEG study: Keppra Technical aspects: This EEG study was done with scalp electrodes positioned according to the 10-20 International system of electrode placement. Electrical activity was acquired at a sampling rate of 500Hz  and reviewed with a high  frequency filter of 70Hz  and a low frequency filter of 1Hz . EEG data were recorded continuously and digitally stored. Description: EEG showed frequent spikes at 1 to 1.5 Hz with overriding fast activity in left temporoparietal region.  EEG also showed continuous generalized and lateralized left hemisphere 2 to 3 Hz slowing admixed with 13 to 15 Hz generalized beta activity.  Hyperventilation for sedation were not performed. Abnormalities -Spikes, left temporoparietal region Continued slow, generalized and lateralized left hemisphere IMPRESSION: This study showed evidence of epileptogenicity in left temporoparietal region as well as cortical dysfunction in the left hemisphere consistent with underlying encephalomalacia as well as craniotomy.  Additionally, there was evidence of severe diffuse encephalopathy, nonspecific to etiology.  No seizures were seen throughout the recording. Dr Roderic Palau was notified immediately. Priyanka Barbra Sarks       Subjective: Unresponsive  Discharge Exam: Vitals:   04/02/2019 0900 04/22/2019 1000 03/28/2019 1122 03/30/2019 1200  BP: 136/90 (!) 130/95  (!) 135/93  Pulse: (!) 102 99  95  Resp: (!) 24 (!) 21 20 (!) 23  Temp:   (!) 97.5 F (36.4 C)   TempSrc:   Oral   SpO2: 90% (!) 89%  95%  Weight:      Height:        General: Unresponsive Cardiovascular: Tachycardic, S1/S2 +, no rubs, no gallops Respiratory: CTA bilaterally, no wheezing, no rhonchi Abdominal: Soft, NT, ND, bowel sounds + Extremities: no edema, no cyanosis    The results of significant diagnostics from this hospitalization (including imaging, microbiology, ancillary and laboratory) are listed below for reference.     Microbiology: Recent Results (from the past 240 hour(s))  SARS CORONAVIRUS 2 (TAT 6-24 HRS) Nasopharyngeal Nasopharyngeal Swab     Status: None   Collection Time: 03/31/19 12:24 PM   Specimen: Nasopharyngeal Swab  Result Value Ref Range Status   SARS Coronavirus 2 NEGATIVE NEGATIVE  Final    Comment: (NOTE) SARS-CoV-2 target nucleic acids are NOT DETECTED. The SARS-CoV-2 RNA is generally detectable in upper and lower respiratory specimens during the acute phase of infection. Negative results do not preclude SARS-CoV-2 infection, do not rule out co-infections with other pathogens, and should not be used as the sole basis for treatment or other patient management decisions. Negative results must be combined with clinical observations, patient history, and epidemiological information. The expected result is Negative. Fact Sheet for Patients: SugarRoll.be Fact Sheet for Healthcare Providers: https://www.woods-mathews.com/ This test is not yet approved or cleared by the [place] FDA and  has been authorized for detection and/or diagnosis of SARS-CoV-2 by FDA under an Emergency Use Authorization (EUA). This EUA will remain  in effect (meaning this test can be used) for the duration of the COVID-19 declaration under Section 56 4(b)(1) of the Act, 21 U.S.C. section 360bbb-3(b)(1), unless the authorization is terminated or revoked sooner. Performed at [facility], [place] [address], [place], [place] [address]   MRSA PCR Screening     Status: None   Collection Time: 03/31/19  5:06 PM   Specimen: Nasal Mucosa; Nasopharyngeal  Result Value Ref Range Status   MRSA by PCR NEGATIVE NEGATIVE Final  Comment:        The GeneXpert MRSA Assay (FDA approved for NASAL specimens only), is one component of a comprehensive MRSA colonization surveillance program. It is not intended to diagnose MRSA infection nor to guide or monitor treatment for MRSA infections. Performed at [facility], [address], [place], [place] [address]      Labs: BNP (last 3 results) No results for input(s): BNP in the last 8760 hours. Basic Metabolic Panel: Recent Labs  Lab 03/31/19 0847 03/31/19 1009 04/01/19 0349 04/02/19 1050  NA 134*   --  138 142  K 2.7*  --  3.3* 2.9*  CL 91*  --  97* 99  CO2 23  --  24 28  GLUCOSE 99  --  111* 110*  BUN 6*  --  8 16  CREATININE 0.66  --  0.56* 0.59*  CALCIUM 9.0  --  8.1* 8.5*  MG  --  1.6* 1.7  --    Liver Function Tests: Recent Labs  Lab 03/31/19 0847  AST 20  ALT 13  ALKPHOS 62  BILITOT 1.5*  PROT 6.1*  ALBUMIN 3.4*   No results for input(s): LIPASE, AMYLASE in the last 168 hours. Recent Labs  Lab 03/31/19 1549  AMMONIA 24   CBC: Recent Labs  Lab 03/31/19 0847 04/01/19 0349 04/02/19 1250  WBC 7.8 7.6 6.6  NEUTROABS 6.1  --   --   HGB 13.0 12.3* 11.3*  HCT 38.8* 38.7* 35.5*  MCV 96.3 100.0 99.2  PLT 229 233 220   Cardiac Enzymes: Recent Labs  Lab 03/31/19 1549  CKTOTAL 363   BNP: Invalid input(s): POCBNP CBG: Recent Labs  Lab 04/02/19 1120 04/02/19 1616 04/02/19 2126 04/26/2019 0812 04/25/2019 1120  GLUCAP 83 95 92 86 83   D-Dimer No results for input(s): DDIMER in the last 72 hours. Hgb A1c No results for input(s): HGBA1C in the last 72 hours. Lipid Profile No results for input(s): CHOL, HDL, LDLCALC, TRIG, CHOLHDL, LDLDIRECT in the last 72 hours. Thyroid function studies No results for input(s): TSH, T4TOTAL, T3FREE, THYROIDAB in the last 72 hours.  Invalid input(s): FREET3 Anemia work up No results for input(s): VITAMINB12, FOLATE, FERRITIN, TIBC, IRON, RETICCTPCT in the last 72 hours. Urinalysis    Component Value Date/Time   COLORURINE YELLOW 03/31/2019 [place] 03/31/2019 0847   LABSPEC 1.014 03/31/2019 0847   PHURINE 6.0 03/31/2019 0847   GLUCOSEU NEGATIVE 03/31/2019 0847   HGBUR MODERATE (A) 03/31/2019 0847   BILIRUBINUR NEGATIVE 03/31/2019 0847   KETONESUR 80 (A) 03/31/2019 0847   PROTEINUR 30 (A) 03/31/2019 0847   NITRITE NEGATIVE 03/31/2019 0847   LEUKOCYTESUR NEGATIVE 03/31/2019 0847   Sepsis Labs Invalid input(s): PROCALCITONIN,  WBC,  LACTICIDVEN Microbiology Recent Results (from the past 240  hour(s))  SARS CORONAVIRUS 2 (TAT 6-24 HRS) Nasopharyngeal Nasopharyngeal Swab     Status: None   Collection Time: 03/31/19 12:24 PM   Specimen: Nasopharyngeal Swab  Result Value Ref Range Status   SARS Coronavirus 2 NEGATIVE NEGATIVE Final    Comment: (NOTE) SARS-CoV-2 target nucleic acids are NOT DETECTED. The SARS-CoV-2 RNA is generally detectable in upper and lower respiratory specimens during the acute phase of infection. Negative results do not preclude SARS-CoV-2 infection, do not rule out co-infections with other pathogens, and should not be used as the sole basis for treatment or other patient management decisions. Negative results must be combined with clinical observations, patient history, and epidemiological information. The expected result  is Negative. Fact Sheet for Patients: SugarRoll.be Fact Sheet for Healthcare Providers: https://www.woods-mathews.com/ This test is not yet approved or cleared by the [place] FDA and  has been authorized for detection and/or diagnosis of SARS-CoV-2 by FDA under an Emergency Use Authorization (EUA). This EUA will remain  in effect (meaning this test can be used) for the duration of the COVID-19 declaration under Section 56 4(b)(1) of the Act, 21 U.S.C. section 360bbb-3(b)(1), unless the authorization is terminated or revoked sooner. Performed at [facility], [place] [address], [place], [place] [address]   MRSA PCR Screening     Status: None   Collection Time: 03/31/19  5:06 PM   Specimen: Nasal Mucosa; Nasopharyngeal  Result Value Ref Range Status   MRSA by PCR NEGATIVE NEGATIVE Final    Comment:        The GeneXpert MRSA Assay (FDA approved for NASAL specimens only), is one component of a comprehensive MRSA colonization surveillance program. It is not intended to diagnose MRSA infection nor to guide or monitor treatment for MRSA infections. Performed at [facility],  [address] [place], [place] [address]      Time coordinating discharge: 74mins  SIGNED:   Kathie Dike, MD  [facility] 04/18/2019, 9:37 PM   If 7PM-7AM, please contact night-coverage [email or web address]

## 2019-04-03 NOTE — Progress Notes (Signed)
Palliative: [name], Kevin [name], remains unchanged, acutely/chronically ill and frail.  Conference with attending, bedside nursing staff, speech therapy, RN CM related to patient condition, needs, disposition  Meeting with brother Kevin [name] and mother Kevin [name].  We talked about [name] current health concerns, no meaningful improvements.  We talked about how to make choices for loved ones.  Kevin [name] and Kevin [name] quickly and easily share that Kevin [name] would want comfort and dignity at end-of-life, residential hospice.  We talked in detail about comfort measures, and what is and is not provided in residential hospice.  Conference with attending, bedside nursing, RN CM, related to patient condition, needs, goals of care, residential hospice request  Plan:   Comfort and dignity at end-of-life, let nature take its course.  Requesting residential hospice at [facility].  Prognosis: 2 weeks or less expected based on sharp functional decline, frailty, aspiration, patient and family at goal to focus on comfort and dignity, let nature take its course.  65 minutes, extended time Kevin Axe, NP Palliative Medicine Team Team Phone # [phone] Greater than 50% of this time was spent counseling and coordinating care related to the above assessment and plan.

## 2019-04-03 NOTE — H&P (Signed)
History and Physical    Kevin [name]:[id number] DOB: 27-Sep-1955 DOA: 03/31/2019  PCP: Celene Squibb, MD  Patient coming from: Home  I have personally briefly reviewed patient's old medical records in [place]  Chief Complaint: Acute encephalopathy  HPI: Kevin [name] is a 64 y.o. male with medical history significant of metastatic lung cancer with brain metastases, seizure disorder, admitted to the hospital with acute encephalopathy and was found on the ground for an unknown period of time.  Hospital course was complicated by aspiration pneumonia and respiratory failure.  Please refer to discharge summary done on 1/7 for further details.  After failure of patient to have meaningful improvement, family elected to pursue comfort measures and residential hospice placement.  At this time, is GIP status awaiting a bed at residential hospice.    Past Medical History:  Diagnosis Date  . Cancer [facility])    Lung cancer with brain metastisis  . Hyperlipidemia   . Hypertension   . Restless legs syndrome (RLS)   . Seasonal allergies   . Smoker    Quit 08/04/12  . Ulcer 1988    Past Surgical History:  Procedure Laterality Date  . APPENDECTOMY  1957   hernia repair at the same time  . [place]  2000, 2002, 2007   bilateral  2x's on right and 1x on the left  . TONSILECTOMY, ADENOIDECTOMY, BILATERAL MYRINGOTOMY AND TUBES  1965    Social History:  reports that he quit smoking about 2 years ago. His smoking use included cigarettes and cigars. He smoked 0.50 packs per day. He has never used smokeless tobacco. He reports current alcohol use of about 22.0 standard drinks of alcohol per week. He reports that he does not use drugs.  No Known Allergies  Family History  Problem Relation Age of Onset  . Lung cancer Father 70  . Colon polyps Brother   . Colon cancer Paternal Aunt   . Colon cancer Cousin   . Esophageal cancer Neg Hx   . Rectal cancer Neg Hx   . Stomach  cancer Neg Hx     Prior to Admission medications   Medication Sig Start Date End Date Taking? Authorizing Provider  hydrochlorothiazide (HYDRODIURIL) 25 MG tablet Take 1 tablet (25 mg total) by mouth daily. 07/24/12 09/23/12  Susy Frizzle, MD    Physical Exam: Vitals:   04/10/2019 1807  BP: 131/88  Resp: (!) 24  SpO2: 90%    Constitutional: Unresponsive Eyes: PERRL, lids and conjunctivae normal Neck: normal, supple, no masses, no thyromegaly Respiratory: clear to auscultation bilaterally, no wheezing, no crackles. Normal respiratory effort. No accessory muscle use.  Cardiovascular: Tachycardic, no murmurs / rubs / gallops. No extremity edema. 2+ pedal pulses. No carotid bruits.  Abdomen: no tenderness, no masses palpated. No hepatosplenomegaly. Bowel sounds positive.    Labs on Admission: I have personally reviewed following labs and imaging studies  CBC: Recent Labs  Lab 03/31/19 0847 04/01/19 0349 04/02/19 1250  WBC 7.8 7.6 6.6  NEUTROABS 6.1  --   --   HGB 13.0 12.3* 11.3*  HCT 38.8* 38.7* 35.5*  MCV 96.3 100.0 99.2  PLT 229 233 427   Basic Metabolic Panel: Recent Labs  Lab 03/31/19 0847 03/31/19 1009 04/01/19 0349 04/02/19 1050  NA 134*  --  138 142  K 2.7*  --  3.3* 2.9*  CL 91*  --  97* 99  CO2 23  --  24 28  GLUCOSE  99  --  111* 110*  BUN 6*  --  8 16  CREATININE 0.66  --  0.56* 0.59*  CALCIUM 9.0  --  8.1* 8.5*  MG  --  1.6* 1.7  --    GFR: Estimated Creatinine Clearance: 103.7 mL/min (A) (by C-G formula based on SCr of 0.59 mg/dL (L)). Liver Function Tests: Recent Labs  Lab 03/31/19 0847  AST 20  ALT 13  ALKPHOS 62  BILITOT 1.5*  PROT 6.1*  ALBUMIN 3.4*   No results for input(s): LIPASE, AMYLASE in the last 168 hours. Recent Labs  Lab 03/31/19 1549  AMMONIA 24   Coagulation Profile: No results for input(s): INR, PROTIME in the last 168 hours. Cardiac Enzymes: Recent Labs  Lab 03/31/19 1549  CKTOTAL 363   BNP (last 3  results) No results for input(s): PROBNP in the last 8760 hours. HbA1C: No results for input(s): HGBA1C in the last 72 hours. CBG: Recent Labs  Lab 04/02/19 1120 04/02/19 1616 04/02/19 2126 04/18/2019 0812 03/29/2019 1120  GLUCAP 83 95 92 86 83   Lipid Profile: No results for input(s): CHOL, HDL, LDLCALC, TRIG, CHOLHDL, LDLDIRECT in the last 72 hours. Thyroid Function Tests: No results for input(s): TSH, T4TOTAL, FREET4, T3FREE, THYROIDAB in the last 72 hours. Anemia Panel: No results for input(s): VITAMINB12, FOLATE, FERRITIN, TIBC, IRON, RETICCTPCT in the last 72 hours. Urine analysis:    Component Value Date/Time   COLORURINE YELLOW 03/31/2019 [place] 03/31/2019 0847   LABSPEC 1.014 03/31/2019 0847   PHURINE 6.0 03/31/2019 0847   GLUCOSEU NEGATIVE 03/31/2019 0847   HGBUR MODERATE (A) 03/31/2019 0847   BILIRUBINUR NEGATIVE 03/31/2019 0847   KETONESUR 80 (A) 03/31/2019 0847   PROTEINUR 30 (A) 03/31/2019 0847   NITRITE NEGATIVE 03/31/2019 0847   LEUKOCYTESUR NEGATIVE 03/31/2019 0847    Radiological Exams on Admission: No results found.    Assessment/Plan Active Problems:   Acute encephalopathy     64 year old male with a history of metastatic lung cancer with brain metastases, admitted with acute encephalopathy.  [place] to the ground for an hour period of time.  He was started on antiepileptics in addition to his baseline Keppra with some improvement of neurologic status, but he has not returned to baseline.  His p.o. intake has been poor and family reported that he has been declining for the last several weeks.  After several conversations, and palliative care assistance, family decided to elect comfort care.  He is awaiting a bed at residential hospice.   Kathie Dike MD [facility]   If 7PM-7AM, please contact night-coverage [email or web address]   03/28/2019, 9:41 PM

## 2019-04-03 NOTE — TOC Transition Note (Addendum)
Transition of Care [facility]) - CM/SW Discharge Note   Patient Details  Name: Kevin [name] MRN: [id number] Date of Birth: February 02, 1956  Transition of Care [facility]) CM/SW Contact:  Alaira Level, Chauncey Reading, RN Phone Number: 04/26/2019, 2:17 PM   Clinical Narrative:   Patient referred to [facility]     Final next level of care: [place] Barriers to Discharge: Barriers Resolved    Discharge Plan and Services   Discharge Planning Services: CM Consult              [facility] Agency: [facility] Date [place]: 04/01/2019 Time [place]: [place] Representative spoke with at [place]: [place]  Social Determinants of Health ([place]) Interventions     Readmission Risk Interventions No flowsheet data found.

## 2019-04-04 DIAGNOSIS — G9340 Encephalopathy, unspecified: Secondary | ICD-10-CM

## 2019-04-04 MED ORDER — CHLORHEXIDINE GLUCONATE CLOTH 2 % EX PADS: 6.0000 | MEDICATED_PAD | Freq: Every day | CUTANEOUS | Status: DC

## 2019-04-04 NOTE — Progress Notes (Signed)
PROGRESS NOTE    ROLLYN SCIALDONE  ZSW:[id number] DOB: 1956-01-03 DOA: 04/08/2019 PCP: Celene Squibb, MD    Assessment & Plan:   Active Problems:   Acute encephalopathy   64 year old male with a history of metastatic lung cancer with brain metastases, admitted with acute encephalopathy.    He was found on the ground by family member and was down for an unknown period of time.  He was started on antiepileptics in addition to his baseline Keppra with some improvement of neurologic status, but he did not return to baseline.  His p.o. intake has been poor and family reported that he had been declining for the last several weeks.  After several conversations, and palliative care assistance, family decided to elect comfort care.  Anticipate in-hospital death  Subjective: Unresponsive  Objective: Vitals:   04/04/19 1000 04/04/19 1200 04/04/19 1600 04/04/19 1700  BP:      Pulse: (!) 119 (!) 123 (!) 125 (!) 125  Resp: (!) 27 (!) 26 (!) 25 (!) 26  Temp:      TempSrc:      SpO2: (!) 61% (!) 59%      Intake/Output Summary (Last 24 hours) at 04/04/2019 2016 Last data filed at 04/04/2019 1705 Gross per 24 hour  Intake --  Output 200 ml  Net -200 ml   There were no vitals filed for this visit.  Examination:  General exam: Unresponsive Respiratory system: Bilateral rhonchi. Respiratory effort normal. Cardiovascular system: S1 & S2 heard, tachycardic. No JVD, murmurs, rubs, gallops or clicks. No pedal edema. Gastrointestinal system: Abdomen is nondistended, soft and nontender. No organomegaly or masses felt. Normal bowel sounds heard.  Data Reviewed: I have personally reviewed following labs and imaging studies  CBC: Recent Labs  Lab 03/31/19 0847 04/01/19 0349 04/02/19 1250  WBC 7.8 7.6 6.6  NEUTROABS 6.1  --   --   HGB 13.0 12.3* 11.3*  HCT 38.8* 38.7* 35.5*  MCV 96.3 100.0 99.2  PLT 229 233 322   Basic Metabolic Panel: Recent Labs  Lab 03/31/19 0847 03/31/19 1009  04/01/19 0349 04/02/19 1050  NA 134*  --  138 142  K 2.7*  --  3.3* 2.9*  CL 91*  --  97* 99  CO2 23  --  24 28  GLUCOSE 99  --  111* 110*  BUN 6*  --  8 16  CREATININE 0.66  --  0.56* 0.59*  CALCIUM 9.0  --  8.1* 8.5*  MG  --  1.6* 1.7  --    GFR: Estimated Creatinine Clearance: 103.7 mL/min (A) (by C-G formula based on SCr of 0.59 mg/dL (L)). Liver Function Tests: Recent Labs  Lab 03/31/19 0847  AST 20  ALT 13  ALKPHOS 62  BILITOT 1.5*  PROT 6.1*  ALBUMIN 3.4*   No results for input(s): LIPASE, AMYLASE in the last 168 hours. Recent Labs  Lab 03/31/19 1549  AMMONIA 24   Coagulation Profile: No results for input(s): INR, PROTIME in the last 168 hours. Cardiac Enzymes: Recent Labs  Lab 03/31/19 1549  CKTOTAL 363   BNP (last 3 results) No results for input(s): PROBNP in the last 8760 hours. HbA1C: No results for input(s): HGBA1C in the last 72 hours. CBG: Recent Labs  Lab 04/02/19 1120 04/02/19 1616 04/02/19 2126 04/23/2019 0812 04/17/2019 1120  GLUCAP 83 95 92 86 83   Lipid Profile: No results for input(s): CHOL, HDL, LDLCALC, TRIG, CHOLHDL, LDLDIRECT in the last 72 hours. Thyroid Function  Tests: No results for input(s): TSH, T4TOTAL, FREET4, T3FREE, THYROIDAB in the last 72 hours. Anemia Panel: No results for input(s): VITAMINB12, FOLATE, FERRITIN, TIBC, IRON, RETICCTPCT in the last 72 hours. Sepsis Labs: Recent Labs  Lab 04/01/19 1116 04/01/19 1455  LATICACIDVEN 1.1 1.1    Recent Results (from the past 240 hour(s))  SARS CORONAVIRUS 2 (TAT 6-24 HRS) Nasopharyngeal Nasopharyngeal Swab     Status: None   Collection Time: 03/31/19 12:24 PM   Specimen: Nasopharyngeal Swab  Result Value Ref Range Status   SARS Coronavirus 2 NEGATIVE NEGATIVE Final    Comment: (NOTE) SARS-CoV-2 target nucleic acids are NOT DETECTED. The SARS-CoV-2 RNA is generally detectable in upper and lower respiratory specimens during the acute phase of infection.  Negative results do not preclude SARS-CoV-2 infection, do not rule out co-infections with other pathogens, and should not be used as the sole basis for treatment or other patient management decisions. Negative results must be combined with clinical observations, patient history, and epidemiological information. The expected result is Negative. Fact Sheet for Patients: SugarRoll.be Fact Sheet for Healthcare Providers: https://www.woods-mathews.com/ This test is not yet approved or cleared by the [place] FDA and  has been authorized for detection and/or diagnosis of SARS-CoV-2 by FDA under an Emergency Use Authorization (EUA). This EUA will remain  in effect (meaning this test can be used) for the duration of the COVID-19 declaration under Section 56 4(b)(1) of the Act, 21 U.S.C. section 360bbb-3(b)(1), unless the authorization is terminated or revoked sooner. Performed at [facility], [place] [address], [place], [place] [address]   MRSA PCR Screening     Status: None   Collection Time: 03/31/19  5:06 PM   Specimen: Nasal Mucosa; Nasopharyngeal  Result Value Ref Range Status   MRSA by PCR NEGATIVE NEGATIVE Final    Comment:        The GeneXpert MRSA Assay (FDA approved for NASAL specimens only), is one component of a comprehensive MRSA colonization surveillance program. It is not intended to diagnose MRSA infection nor to guide or monitor treatment for MRSA infections. Performed at [facility], [address], [place], [place] [address]          Radiology Studies: No results found.      Scheduled Meds: . Chlorhexidine Gluconate Cloth  6 each Topical Daily   Continuous Infusions:   LOS: 1 day    Time spent: 67mins    Kathie Dike, MD [facility]   If 7PM-7AM, please contact night-coverage [email or web address]  04/04/2019, 8:16 PM

## 2019-04-11 DIAGNOSIS — C7931 Secondary malignant neoplasm of brain: Secondary | ICD-10-CM | POA: Diagnosis present

## 2019-04-11 DIAGNOSIS — E1169 Type 2 diabetes mellitus with other specified complication: Secondary | ICD-10-CM | POA: Diagnosis present

## 2019-04-11 DIAGNOSIS — C3490 Malignant neoplasm of unspecified part of unspecified bronchus or lung: Secondary | ICD-10-CM | POA: Diagnosis present

## 2019-04-11 DIAGNOSIS — J690 Pneumonitis due to inhalation of food and vomit: Secondary | ICD-10-CM | POA: Diagnosis present

## 2019-04-28 NOTE — Progress Notes (Signed)
Spoke with Kevin [name] to notify of his brother's passing. They are unsure of funeral homes at the time. Gave him the number to patient placement.

## 2019-04-28 NOTE — Progress Notes (Signed)
[facility] to start process as death is impending. Pt is not candidate for organ or tissue but possibly for eye. Reference number [id number] and spoke with Sheliah Hatch.

## 2019-04-28 NOTE — Progress Notes (Signed)
Spoke with on call RN at [facility] to notify them of patient passing.

## 2019-04-28 NOTE — Death Summary Note (Signed)
DEATH SUMMARY   Patient Details  Name: Kevin [name] MRN: [id number] DOB: Jul 05, 1955  Admission/Discharge Information   Admit Date:  2019/04/10  Date of Death: Date of Death: 2019/04/12  Time of Death: Time of Death: 0431  Length of Stay: 2  Referring Physician: Celene Squibb, MD   Reason(s) for Hospitalization  Unresponsive, found down at home  Diagnoses  Preliminary cause of death:  Secondary Diagnoses (including complications and co-morbidities):  Active Problems:   Hypertension   Acute encephalopathy   DNR (do not resuscitate) discussion   Seizure disorder ([place])   Acute respiratory failure with hypoxia ([place])   Encounter for hospice care discussion   Brain metastases ([place])   Lung cancer ([place])   Aspiration pneumonia of both lower lobes due to gastric secretions ([place])   Type 2 diabetes mellitus with hyperlipidemia [facility])   Brief Hospital Course (including significant findings, care, treatment, and services provided and events leading to death)  Kevin [name] is a 64 y.o. year old male who had a history of metastatic lung cancer with brain metastases, seizure disorder, was admitted to the hospital with acute encephalopathy when he was found down on the ground at home for an unknown period of time.  His hospital course was complicated by acute respiratory failure secondary to aspiration pneumonia.  He was treated with intravenous antibiotics.  Please see discharge summary done on 04-09-22 for further details of his hospital course.  He was seen by neurology and underwent EEG that did show some epileptiform discharges.  In addition to [place] he was started on Vimpat with transient improvement in mental status.  Patient did not have any meaningful improvement in his overall functional status/mental status.  After discussing with palliative care, family elected to pursue comfort measures and residential hospice placement.  Since there were no hospice beds available, he was changed to GIP  status.  The patient was monitored in the hospital and treated supportively for comfort.  He passed away in the hospital.  Family was informed and provided support.    Pertinent Labs and Studies  Significant Diagnostic Studies CT Head Wo Contrast  Result Date: 03/31/2019 CLINICAL DATA:  Pt's mother called ems for altered mental status. She was going to pick him up for his radiation tx today due to brain ca and found him "unresponsive". Pt will respond to his name but nothing else.Focal neuro deficit, > 6 hrs, stroke suspected EXAM: CT HEAD WITHOUT CONTRAST TECHNIQUE: Contiguous axial images were obtained from the base of the skull through the vertex without intravenous contrast. COMPARISON:  Head CT 03/04/2019, brain MRI 10/17/2018 FINDINGS: Brain: Patient status post LEFT craniotomy and tumor section. Encephalomalacia and vasogenic edema within the LEFT parietal lobe and LEFT temporal lobe are similar to similar comparison exam. No acute intracranial hemorrhage. No midline shift or mass effect. No CT evidence of cortical infarction. Vascular: No hyperdense vessel or unexpected calcification. Skull: LEFT parietal craniotomy. No acute findings Sinuses/Orbits: No acute finding. Other: IMPRESSION: 1. No CT evidence of acute cortical infarction. 2. Postsurgical encephalomalacia and vasogenic edema in the LEFT temporoparietal lobe unchanged from comparison exam. No acute intracranial findings. Electronically Signed   By: Suzy Bouchard M.D.   On: 03/31/2019 10:28   DG CHEST PORT 1 VIEW  Result Date: 04/01/2019 CLINICAL DATA:  Hypoxia in the ICU with history of metastatic lung cancer EXAM: PORTABLE CHEST 1 VIEW COMPARISON:  Radiograph 03/31/2019 FINDINGS: Right IJ approach Port-A-Cath tip terminates at the level of the right  atrium. Port is currently accessed via [place] needle. There is some increasing vascular congestion with hazy interstitial changes which could reflect developing edema. Cardiomediastinal  contours are unchanged from prior with a calcified, tortuous aorta. Degenerative changes are present in the imaged spine and shoulders. No acute osseous or soft tissue abnormality. Telemetry leads overlie the chest. IMPRESSION: 1. Increasing vascular congestion with hazy interstitial changes which could reflect developing edema. 2. Right IJ approach Port-A-Cath tip terminates at the level of the right atrium. Electronically Signed   By: Lovena Le M.D.   On: 04/01/2019 04:52   DG CHEST PORT 1 VIEW  Result Date: 03/31/2019 CLINICAL DATA:  Chest congestion, lethargy, recent seizure EXAM: PORTABLE CHEST 1 VIEW COMPARISON:  03/31/2019 FINDINGS: Unchanged AP portable examination. No acute appearing airspace opacity. Right chest port catheter. Heart mediastinum are unremarkable. IMPRESSION: Unchanged AP portable examination. No acute appearing airspace opacity. Electronically Signed   By: Eddie Candle M.D.   On: 03/31/2019 16:58   DG Chest Portable 1 View  Result Date: 03/31/2019 CLINICAL DATA:  Hypoxia EXAM: PORTABLE CHEST 1 VIEW COMPARISON:  03/04/2019 FINDINGS: Right chest wall port catheter is unchanged. Probable minor atelectasis at the lung bases. Stable cardiomediastinal contours. No pleural effusion or pneumothorax. IMPRESSION: Minor bibasilar atelectasis. Electronically Signed   By: Macy Mis M.D.   On: 03/31/2019 09:12   EEG adult  Result Date: 04/01/2019 Lora Havens, MD     04/01/2019  9:55 AM Patient Name: Kevin [name] MRN: [id number] Epilepsy Attending: Lora Havens Referring Physician/Provider: Dr. Heath Lark Date: 03/31/2018 Duration: 24.10 minutes Patient history: 64 year old male with metastatic lung cancer with brain mets, left temporoparietal craniotomy with altered mental status.  EEG to evaluate for seizures. Level of alertness: Lethargic AEDs during EEG study: Keppra Technical aspects: This EEG study was done with scalp electrodes positioned according to the 10-20  International system of electrode placement. Electrical activity was acquired at a sampling rate of 500Hz  and reviewed with a high frequency filter of 70Hz  and a low frequency filter of 1Hz . EEG data were recorded continuously and digitally stored. Description: EEG showed frequent spikes at 1 to 1.5 Hz with overriding fast activity in left temporoparietal region.  EEG also showed continuous generalized and lateralized left hemisphere 2 to 3 Hz slowing admixed with 13 to 15 Hz generalized beta activity.  Hyperventilation for sedation were not performed. Abnormalities -Spikes, left temporoparietal region Continued slow, generalized and lateralized left hemisphere IMPRESSION: This study showed evidence of epileptogenicity in left temporoparietal region as well as cortical dysfunction in the left hemisphere consistent with underlying encephalomalacia as well as craniotomy.  Additionally, there was evidence of severe diffuse encephalopathy, nonspecific to etiology.  No seizures were seen throughout the recording. Dr Roderic Palau was notified immediately. Lora Havens    Microbiology No results found for this or any previous visit (from the past 240 hour(s)).  Lab Basic Metabolic Panel: No results for input(s): NA, K, CL, CO2, GLUCOSE, BUN, CREATININE, CALCIUM, MG, PHOS in the last 168 hours. Liver Function Tests: No results for input(s): AST, ALT, ALKPHOS, BILITOT, PROT, ALBUMIN in the last 168 hours. No results for input(s): LIPASE, AMYLASE in the last 168 hours. No results for input(s): AMMONIA in the last 168 hours. CBC: No results for input(s): WBC, NEUTROABS, HGB, HCT, MCV, PLT in the last 168 hours. Cardiac Enzymes: No results for input(s): CKTOTAL, CKMB, CKMBINDEX, TROPONINI in the last 168 hours. Sepsis Labs: No results for input(s): PROCALCITON, WBC, LATICACIDVEN  in the last 168 hours.  Procedures/Operations     [facility] 04/11/2019, 3:33 PM

## 2019-04-28 DEATH — deceased

## 2020-08-12 IMAGING — CT CT HEAD W/O CM
3 series · 14 of 47 positions shown, 16 images · non-contrast
Comparison: MRI 10/17/2018

CLINICAL DATA: Altered level of consciousness

EXAM:
CT HEAD WITHOUT CONTRAST
TECHNIQUE: Contiguous axial images were obtained from the base of the skull
through the vertex without intravenous contrast.

[Series 2: head w o · axial · 0.46mm/px · z∈[+729,+874]mm · 8 of 35 slices shown, 10 images]
[im 3/35  brain]
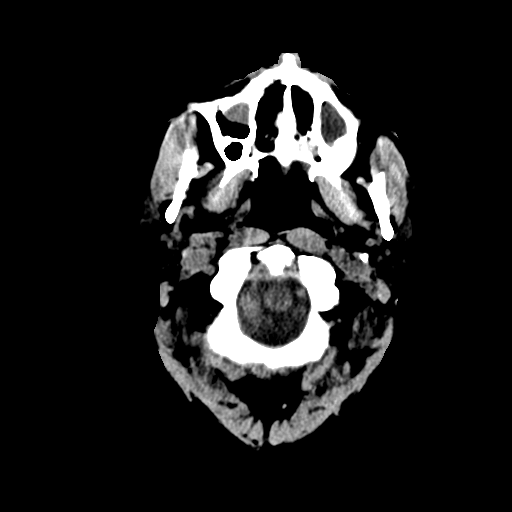
[im 3/35  bone]
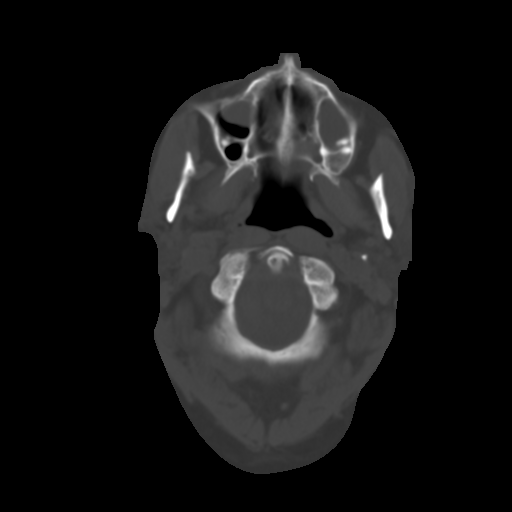
[im 8/35  brain]
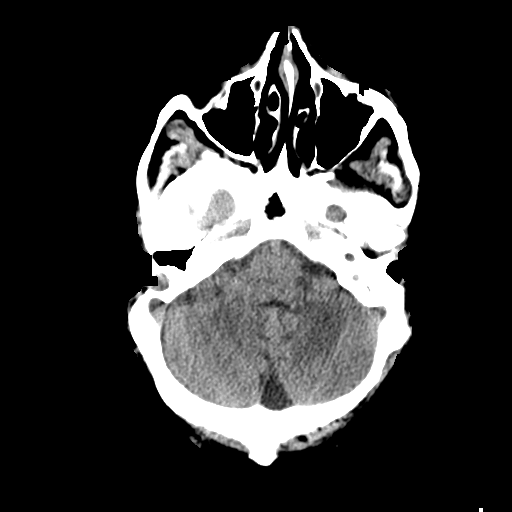
[im 11/35  brain]
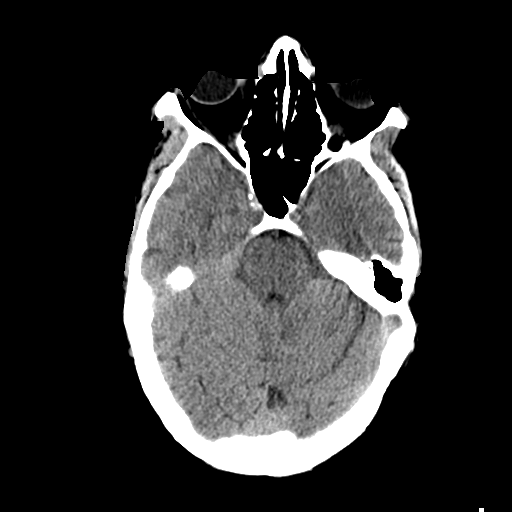
[im 16/35  brain]
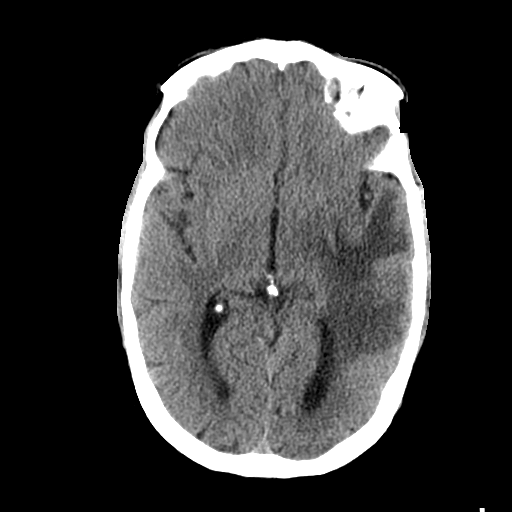
[im 19/35  brain]
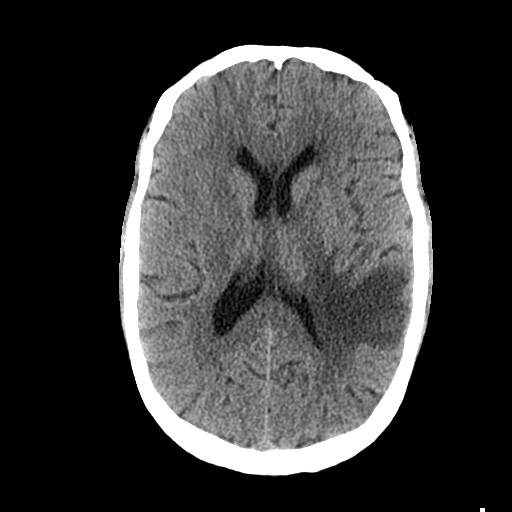
[im 19/35  bone]
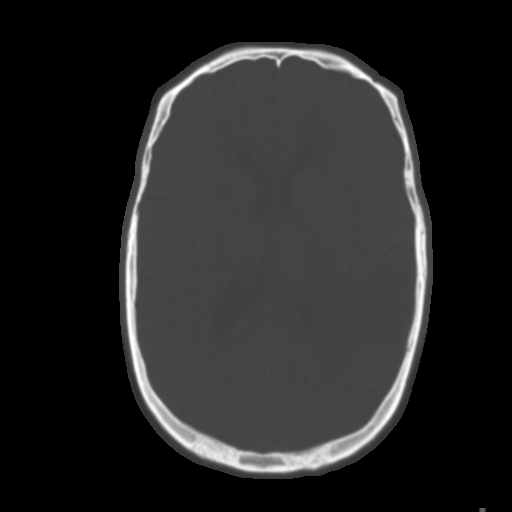
[im 24/35  brain]
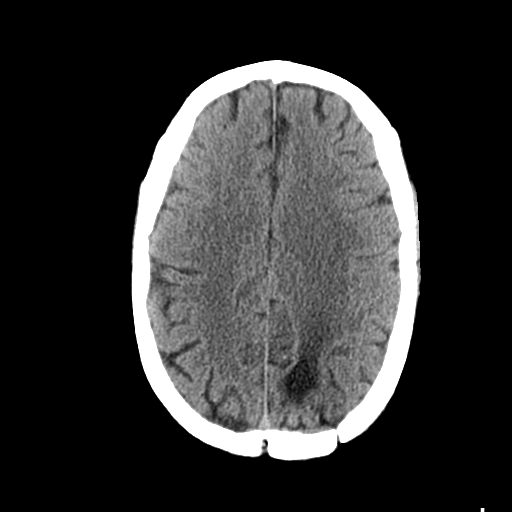
[im 27/35  brain]
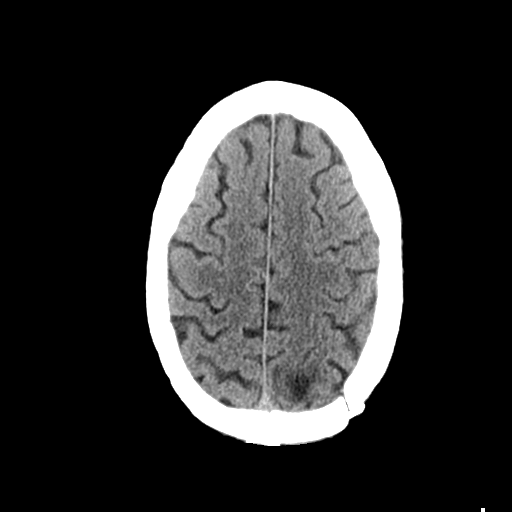
[im 32/35  brain]
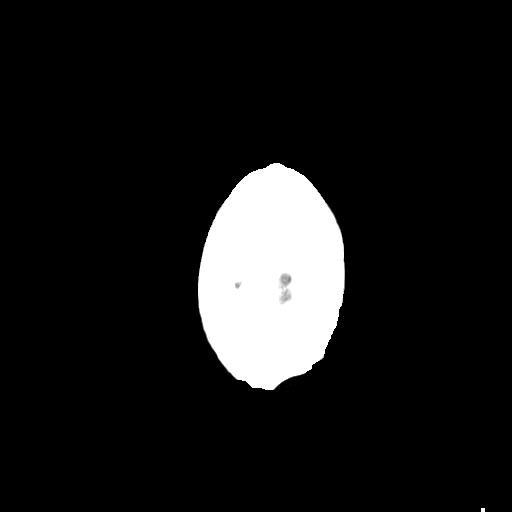

[Series 4: coronal soft · coronal · 0.32mm/px · 3 of 76 slices shown]
[im 26/76  brain]
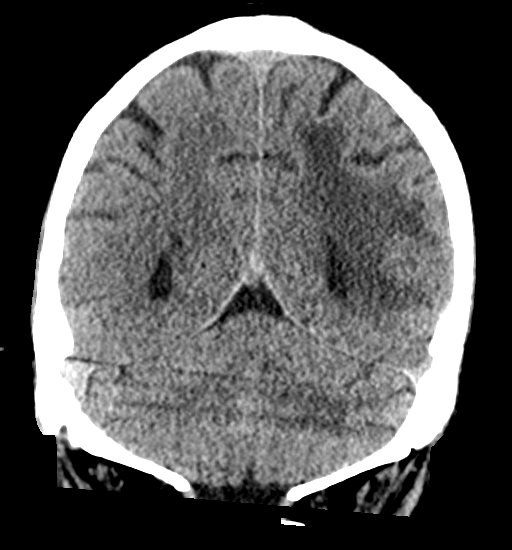
[im 34/76  brain]
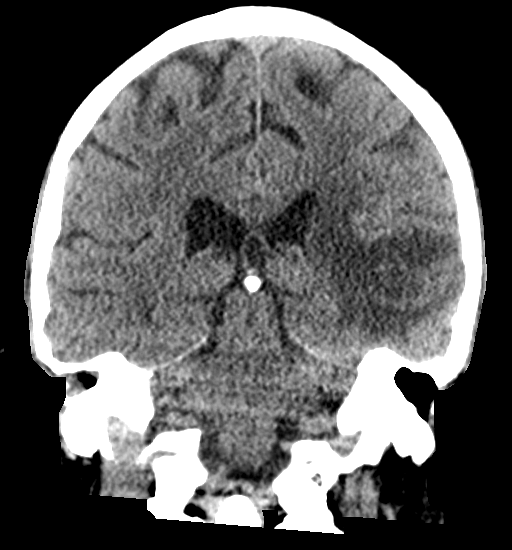
[im 42/76  brain]
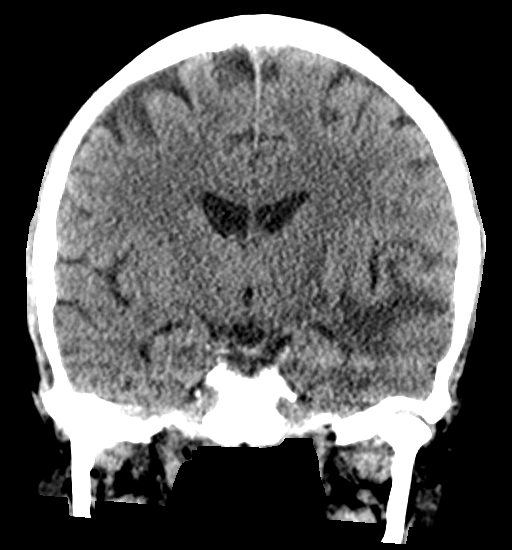

[Series 5: sagittal soft · sagittal · 0.34mm/px · 3 of 54 slices shown]
[im 18/54  brain]
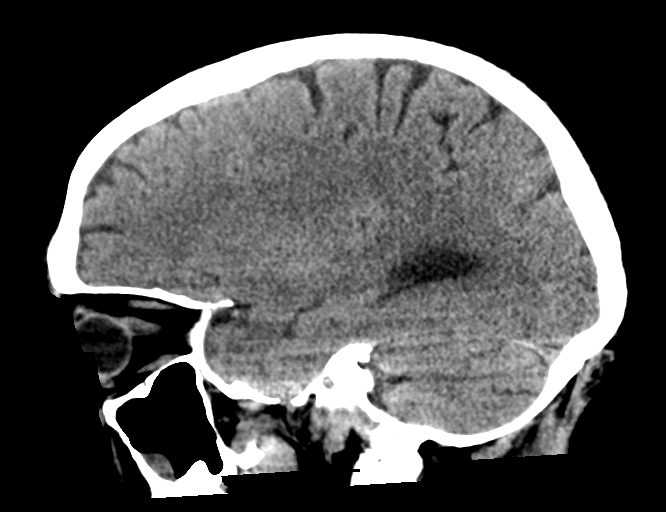
[im 27/54  brain]
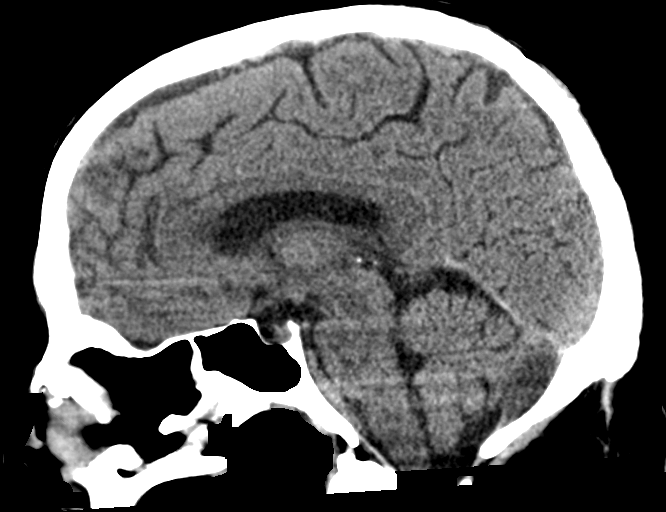
[im 36/54  brain]
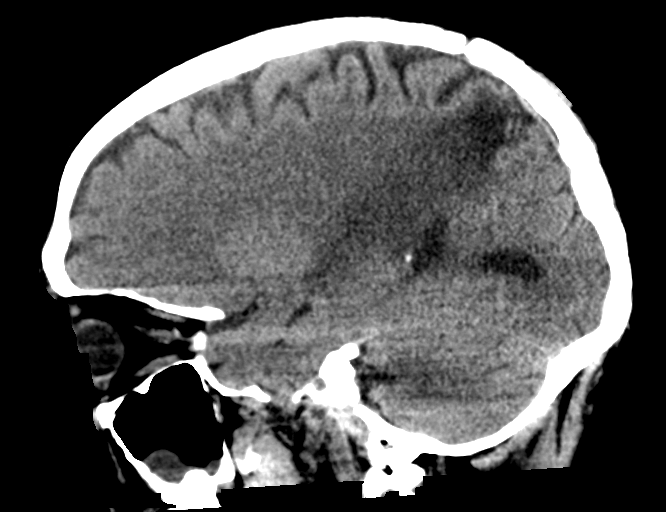

[14 of 47 positions shown; findings below may reference images not displayed]

FINDINGS: Brain: There are postsurgical changes from prior left occipital
craniotomy with likely tumoral debulking/resection with a CSF
attenuation resection cavity in the left occipital lobe ([DATE]).
Suggestion of some residual tumor burden in the left temporal lobe
with ill-defined margins poorly visualized on this noncontrast CT of
the head. Extensive surrounding vasogenic edema is again seen
throughout the left temporal parietal and occipital lobes. No
hyperdense hemorrhage is seen. No CT evidence of large territory or
cortically based infarct. Partially empty sella is again noted.
Arachnoid cyst at the right cavernous sinus is partially visualized
as well as a expanded retrocerebellar CSF space likely reflecting
mega cisterna magna or additional arachnoid cyst.

Vascular: No hyperdense vessel or unexpected calcification.

Skull: Post craniotomy changes, as above. No scalp swelling or
hematoma. No suspicious osseous lesions.

Sinuses/Orbits: Mild mural thickening in the maxillary and ethmoid
sinuses. No air-fluid levels. Included orbital structures are
unremarkable.

Other: None
IMPRESSION: 1. Postoperative changes from prior left occipital craniotomy with
likely tumoral debulking/resection with a CSF attenuation resection
cavity in the left occipital lobe. Suggestion of some residual tumor
burden in the left temporal lobe with ill-defined margins poorly
visualized on this noncontrast CT of the head. Consider further
evaluation with contrast-enhanced MR for better evaluation.
2. Extensive surrounding vasogenic edema throughout the left
temporal parietal and occipital lobes.
3. No hyperdense hemorrhage is seen. No CT evidence of large
territory or cortically based infarct.
4. Arachnoid cyst at the right cavernous sinus is partially
visualized as well as an expanded retrocerebellar CSF space likely
reflecting mega cisterna magna or additional arachnoid cyst, stable
prior.
